# Patient Record
Sex: Female | Born: 1975 | Race: White | Hispanic: No | Marital: Married | Smoking: Never smoker
Health system: Southern US, Community
[De-identification: ages and names within clinical notes are randomized; demographics above are authoritative.]

## PROBLEM LIST (undated history)

## (undated) DIAGNOSIS — M4646 Discitis, unspecified, lumbar region: Secondary | ICD-10-CM

## (undated) HISTORY — DX: Discitis, unspecified, lumbar region: M46.46

---

## 2017-02-07 ENCOUNTER — Inpatient Hospital Stay (HOSPITAL_COMMUNITY)
Admission: AD | Admit: 2017-02-07 | Discharge: 2017-02-12 | DRG: 372 | Disposition: A | Discharge status: Home Health | Payer: No Typology Code available for payment source | Source: Ambulatory Visit | Attending: Internal Medicine | Admitting: Internal Medicine

## 2017-02-07 ENCOUNTER — Encounter: Payer: Self-pay | Admitting: Infectious Diseases

## 2017-02-07 ENCOUNTER — Ambulatory Visit (INDEPENDENT_AMBULATORY_CARE_PROVIDER_SITE_OTHER): Payer: No Typology Code available for payment source | Admitting: Infectious Diseases

## 2017-02-07 ENCOUNTER — Encounter (HOSPITAL_COMMUNITY): Payer: Self-pay

## 2017-02-07 ENCOUNTER — Other Ambulatory Visit: Payer: Self-pay

## 2017-02-07 DIAGNOSIS — R109 Unspecified abdominal pain: Secondary | ICD-10-CM | POA: Diagnosis not present

## 2017-02-07 DIAGNOSIS — M4626 Osteomyelitis of vertebra, lumbar region: Secondary | ICD-10-CM | POA: Diagnosis present

## 2017-02-07 DIAGNOSIS — R531 Weakness: Secondary | ICD-10-CM | POA: Diagnosis not present

## 2017-02-07 DIAGNOSIS — M4646 Discitis, unspecified, lumbar region: Secondary | ICD-10-CM | POA: Diagnosis present

## 2017-02-07 DIAGNOSIS — E876 Hypokalemia: Secondary | ICD-10-CM | POA: Diagnosis present

## 2017-02-07 DIAGNOSIS — Z8249 Family history of ischemic heart disease and other diseases of the circulatory system: Secondary | ICD-10-CM

## 2017-02-07 DIAGNOSIS — K6812 Psoas muscle abscess: Secondary | ICD-10-CM | POA: Diagnosis not present

## 2017-02-07 DIAGNOSIS — Z978 Presence of other specified devices: Secondary | ICD-10-CM | POA: Diagnosis not present

## 2017-02-07 DIAGNOSIS — Z95828 Presence of other vascular implants and grafts: Secondary | ICD-10-CM | POA: Diagnosis not present

## 2017-02-07 DIAGNOSIS — M79651 Pain in right thigh: Secondary | ICD-10-CM | POA: Diagnosis present

## 2017-02-07 DIAGNOSIS — B954 Other streptococcus as the cause of diseases classified elsewhere: Secondary | ICD-10-CM | POA: Diagnosis not present

## 2017-02-07 DIAGNOSIS — M79604 Pain in right leg: Secondary | ICD-10-CM | POA: Diagnosis not present

## 2017-02-07 HISTORY — DX: Discitis, unspecified, lumbar region: M46.46

## 2017-02-07 LAB — CBC WITH DIFFERENTIAL/PLATELET
Basophils Absolute: 0 10*3/uL (ref 0.0–0.1)
Basophils Relative: 0 %
EOS ABS: 0 10*3/uL (ref 0.0–0.7)
Eosinophils Relative: 0 %
HCT: 27.8 % — ABNORMAL LOW (ref 36.0–46.0)
Hemoglobin: 8.9 g/dL — ABNORMAL LOW (ref 12.0–15.0)
LYMPHS ABS: 1.8 10*3/uL (ref 0.7–4.0)
Lymphocytes Relative: 10 %
MCH: 27.3 pg (ref 26.0–34.0)
MCHC: 32 g/dL (ref 30.0–36.0)
MCV: 85.3 fL (ref 78.0–100.0)
MONO ABS: 0.7 10*3/uL (ref 0.1–1.0)
Monocytes Relative: 4 %
Neutro Abs: 14.9 10*3/uL — ABNORMAL HIGH (ref 1.7–7.7)
Neutrophils Relative %: 86 %
Platelets: 719 10*3/uL — ABNORMAL HIGH (ref 150–400)
RBC: 3.26 MIL/uL — AB (ref 3.87–5.11)
RDW: 14.3 % (ref 11.5–15.5)
WBC: 17.4 10*3/uL — AB (ref 4.0–10.5)

## 2017-02-07 LAB — COMPREHENSIVE METABOLIC PANEL
ALK PHOS: 124 U/L (ref 38–126)
ALT: 27 U/L (ref 14–54)
AST: 27 U/L (ref 15–41)
Albumin: 2.3 g/dL — ABNORMAL LOW (ref 3.5–5.0)
Anion gap: 13 (ref 5–15)
BILIRUBIN TOTAL: 0.4 mg/dL (ref 0.3–1.2)
BUN: 7 mg/dL (ref 6–20)
CALCIUM: 8.2 mg/dL — AB (ref 8.9–10.3)
CO2: 27 mmol/L (ref 22–32)
Chloride: 92 mmol/L — ABNORMAL LOW (ref 101–111)
Creatinine, Ser: 0.65 mg/dL (ref 0.44–1.00)
GFR calc Af Amer: >60 mL/min (ref >60)
GFR calc non Af Amer: >60 mL/min (ref >60)
GLUCOSE: 122 mg/dL — AB (ref 65–99)
Potassium: 2.9 mmol/L — ABNORMAL LOW (ref 3.5–5.1)
SODIUM: 132 mmol/L — AB (ref 135–145)
TOTAL PROTEIN: 7.1 g/dL (ref 6.5–8.1)

## 2017-02-07 LAB — LACTIC ACID, PLASMA: LACTIC ACID, VENOUS: 1.1 mmol/L (ref 0.5–1.9)

## 2017-02-07 MED ORDER — ACETAMINOPHEN 325 MG PO TABS
650.0000 mg | ORAL_TABLET | Freq: Four times a day (QID) | ORAL | Status: DC | PRN
  Administered 2017-02-07 – 2017-02-10 (×7): 650 mg via ORAL
  Filled 2017-02-07 (×7): qty 2

## 2017-02-07 MED ORDER — SODIUM CHLORIDE 0.9 % IV SOLN
INTRAVENOUS | Status: DC
  Administered 2017-02-07 – 2017-02-11 (×9): via INTRAVENOUS

## 2017-02-07 MED ORDER — CEFTRIAXONE SODIUM 1 G IJ SOLR
1.0000 g | Freq: Once | INTRAMUSCULAR | Status: AC
  Administered 2017-02-07: 1 g via INTRAVENOUS
  Filled 2017-02-07: qty 10

## 2017-02-07 MED ORDER — VANCOMYCIN HCL IN DEXTROSE 1-5 GM/200ML-% IV SOLN
1000.0000 mg | Freq: Once | INTRAVENOUS | Status: AC
  Administered 2017-02-07: 20:00:00 1000 mg via INTRAVENOUS
  Filled 2017-02-07: qty 200

## 2017-02-07 MED ORDER — MORPHINE SULFATE (PF) 2 MG/ML IV SOLN
2.0000 mg | INTRAVENOUS | Status: DC | PRN
  Administered 2017-02-09: 2 mg via INTRAVENOUS
  Filled 2017-02-07: qty 1

## 2017-02-07 MED ORDER — POTASSIUM CHLORIDE CRYS ER 20 MEQ PO TBCR
40.0000 meq | EXTENDED_RELEASE_TABLET | ORAL | Status: AC
  Administered 2017-02-07 – 2017-02-08 (×2): 40 meq via ORAL
  Filled 2017-02-07 (×2): qty 2

## 2017-02-07 MED ORDER — HYDROCODONE-ACETAMINOPHEN 5-325 MG PO TABS
1.0000 | ORAL_TABLET | ORAL | Status: DC | PRN
  Administered 2017-02-09: 11:00:00 1 via ORAL
  Administered 2017-02-09: 2 via ORAL
  Filled 2017-02-07: qty 2
  Filled 2017-02-07: qty 1

## 2017-02-07 MED ORDER — ENSURE ENLIVE PO LIQD: 237.0000 mL | Freq: Two times a day (BID) | ORAL | Status: DC

## 2017-02-07 NOTE — Progress Notes (Signed)
Dr.Adhikari notified of lab values and vital signs.

## 2017-02-07 NOTE — Progress Notes (Signed)
   Subjective:    Patient ID: Joy Morris, female    DOB: January 18, 1975, 42 y.o.   MRN: [id number]  HPI 42 yo F with hx of back pain begining around christmas 2018. Her pain worsened and radiated around her leg. She also developed paresthesias down her legs. She had been given medrol dose packs without relief. She was seen by orthopedics 01-20-17 and underwent MRI on 01-31-17. 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  Still having pain, concentrated around front of her R leg. Also feels that R leg doesn't want to "lift on it's own". Pain has been constant.  On meloxicam, muscle relaxer, tylenol #3 Has been having cold sweats, drenching. Having chills as well.   Works as an Airline pilotoptitrician.   The past medical history, family history and social history were reviewed/updated in EPIC  Review of Systems  Constitutional: Positive for appetite change, chills, fever and unexpected weight change.  Respiratory: Positive for shortness of breath. Negative for cough.   Cardiovascular: Negative for chest pain.  Gastrointestinal: Positive for constipation. Negative for diarrhea.  Genitourinary: Negative for difficulty urinating.  Neurological: Positive for weakness and numbness.  Psychiatric/Behavioral: Positive for dysphoric mood. Negative for sleep disturbance.  pleuritic pain.      Objective:   Physical Exam  Constitutional: She is oriented to person, place, and time. She appears well-developed and well-nourished.  HENT:  Mouth/Throat: No oropharyngeal exudate.  Eyes: EOM are normal. Pupils are equal, round, and reactive to light.  Neck: Neck supple.  Cardiovascular: Normal rate, regular rhythm and normal heart sounds.  Pulmonary/Chest: Effort normal and breath sounds normal.  Abdominal: Soft. Bowel sounds are normal. There is no tenderness. There is no rebound.  Musculoskeletal: Normal range of motion. She exhibits no edema.  Lymphadenopathy:    She has no  cervical adenopathy.  Neurological: She is alert and oriented to person, place, and time.  Decreased light touch in R ankle.  She has mild decrease in strength in R quads.        Assessment & Plan:

## 2017-02-07 NOTE — Assessment & Plan Note (Addendum)
Denies breaks in her skin in last several months.  No hx of surgery.  We discussed possible options- 1) admit her, have her seen by ortho, have her seen by IR for aspirate, have her seen by IV team, get PIC, home with vanco/ceftriaxone while we await Cx.  I favor this as she does have some subjective findings in her R leg (wekaness and numbness).  2) have her seen by IR for aspirate and PIC outpt, surgery outpt, start anbx.  She opts for option (1) due to risk of sepsis, worsening of neuro function.  I called the hospitalist admitting number, she will be adm to [facility]. Spoke with Dr Janee Mornhompson Will have ID see her in the hospital.  I left a message on  Ortho PA's phone regarding admission.

## 2017-02-07 NOTE — H&P (Signed)
History and Physical    Joy PennaCorie Morris ZOX:[id number] DOB: 10-27-1975 DOA: 02/07/2017  PCP: Patient, No Pcp Per   Patient coming from: Home    Chief Complaint: Pain on the right thigh  HPI: Joy Morris is a 42 y.o. female with no significant past medical history who was sent from infectious disease office with a direct admission for the drainage of psoas abscess. As per the patient, she started having back pain, right thigh pain that started on December 22, 2016.  Her pain worsened and it started radiating towards her leg.  She also developed paresthesia.   Patient was seen by orthopedics on 01/20/17 .She was being  treated with antispasmodics and  pain medications.  She underwent MRI on 01/31/17.  As per the documentation, she was found to have discitis, osteomyelitis of L2-L3 with large right iliopsoas abscess. Upon asking, patient is not aware that she had been diagnosed with infection in her bone. Patient also reported having chills and night sweats.  She denies having fever though.  Patient does not have any significant past medical history.  She has been walking with the help of cane recently. The patient was referred to infectious disease by orthopedics after the MRI finding.  Infectious disease seen the patient with direct admission to the hospital for drainage of the abscess. Patient seen and examined the bedside.  She does not look toxic.  She complains of pain on her right lower abdomen, right thigh and says she has difficulty lifting her right lower extremity.  There is swelling of right thigh but the overlying skin is normal.  There is generalized tenderness over her left lower abdomen and right upper thigh. IR has been notified and she will undergo IR guided drainage of abscess tomorrow.  Plan is to start on antibiotics tomorrow for the drainage procedure.  ED Course: None  Review of Systems: As per HPI otherwise 10 point review of systems negative.    Past Medical History:    Diagnosis Date  . Discitis of lumbar region 02/07/2017       reports that  has never smoked. she has never used smokeless tobacco. She reports that she does not drink alcohol or use drugs.  No Known Allergies  Family History  Problem Relation Age of Onset  . Hypertension Mother   . Hyperlipidemia Mother   . Leukemia Father        CML     Prior to Admission medications   Medication Sig Start Date End Date Taking? Authorizing Provider  acetaminophen-codeine (TYLENOL #3) 300-30 MG tablet acetaminophen 300 mg-codeine 30 mg tablet    [provider]  ibuprofen (ADVIL,MOTRIN) 800 MG tablet  01/31/17   [provider]  ibuprofen (ADVIL,MOTRIN) 800 MG tablet ibuprofen 800 mg tablet  Take 1 tablet every 8 hours by oral route as needed.    [provider]  meloxicam (MOBIC) 15 MG tablet meloxicam 15 mg tablet    [provider]  methocarbamol (ROBAXIN) 500 MG tablet TK 2 TS PO Q 6 TO 8 H PRN 01/08/17   [provider]  methylPREDNISolone (MEDROL DOSEPAK) 4 MG TBPK tablet TK UTD 01/15/17   [provider]    Physical Exam: There were no vitals filed for this visit.  Constitutional: NAD, calm, comfortable There were no vitals filed for this visit. Eyes: PERRL, lids and conjunctivae normal ENMT: Mucous membranes are moist. Posterior pharynx clear of any exudate or lesions.Normal dentition.  Neck: normal, supple, no masses, no  thyromegaly Respiratory: clear to auscultation bilaterally, no wheezing, no crackles. Normal respiratory effort. No accessory muscle use.  Cardiovascular: Regular rate and rhythm, no murmurs / rubs / gallops. No extremity edema. 2+ pedal pulses. No carotid bruits.  Abdomen: no tenderness, no masses palpated. No hepatosplenomegaly. Bowel sounds positive.  Musculoskeletal: no clubbing / cyanosis. No joint deformity upper and lower extremities. Swelling of the right upper thigh, generalized tenderness on the right  lower abdomen and right upper thigh , decreased range of motion of the whole right lower extremity skin: no rashes, lesions, ulcers. No induration Neurologic: CN 2-12 grossly intact. Sensation intact, DTR normal. Strength 5/5 in all 4.  Psychiatric: Normal judgment and insight. Alert and oriented x 3. Normal mood.   Foley Catheter:None  Labs on Admission: I have personally reviewed following labs and imaging studies  CBC: No results for input(s): WBC, NEUTROABS, HGB, HCT, MCV, PLT in the last 168 hours. Basic Metabolic Panel: No results for input(s): NA, K, CL, CO2, GLUCOSE, BUN, CREATININE, CALCIUM, MG, PHOS in the last 168 hours. GFR: CrCl cannot be calculated (No order found.). Liver Function Tests: No results for input(s): AST, ALT, ALKPHOS, BILITOT, PROT, ALBUMIN in the last 168 hours. No results for input(s): LIPASE, AMYLASE in the last 168 hours. No results for input(s): AMMONIA in the last 168 hours. Coagulation Profile: No results for input(s): INR, PROTIME in the last 168 hours. Cardiac Enzymes: No results for input(s): CKTOTAL, CKMB, CKMBINDEX, TROPONINI in the last 168 hours. BNP (last 3 results) No results for input(s): PROBNP in the last 8760 hours. HbA1C: No results for input(s): HGBA1C in the last 72 hours. CBG: No results for input(s): GLUCAP in the last 168 hours. Lipid Profile: No results for input(s): CHOL, HDL, LDLCALC, TRIG, CHOLHDL, LDLDIRECT in the last 72 hours. Thyroid Function Tests: No results for input(s): TSH, T4TOTAL, FREET4, T3FREE, THYROIDAB in the last 72 hours. Anemia Panel: No results for input(s): VITAMINB12, FOLATE, FERRITIN, TIBC, IRON, RETICCTPCT in the last 72 hours. Urine analysis: No results found for: COLORURINE, APPEARANCEUR, LABSPEC, PHURINE, GLUCOSEU, HGBUR, BILIRUBINUR, KETONESUR, PROTEINUR, UROBILINOGEN, NITRITE, LEUKOCYTESUR  Radiological Exams on Admission: No results found.   Assessment/Plan Principal Problem:   Psoas  abscess, right (HCC)  Right Psoas Abscess: Interventional radiology consulted.  Will undergo IR guided drainage tomorrow. Will start on IV antibiotics with vancomycin and Rocephin after the drainage.  We will follow-up the culture report. We will also consult orthopedics.  She was seen by Dr. Yevette Edwards as an outpatient. Will get CBC/CMP. We will continue pain management as appropriate.  Osteomyelitis/Discitis: Infectious disease note on 02/07/17 says that she was found to have discitis, osteomyelitis of L2-L3. Patient not aware of this diagnosis.  ID will follow.  Severity of Illness:   I certify that at the point of admission it is my clinical judgment that the patient will require inpatient hospital care spanning beyond 2 midnights from the point of admission due to high intensity of service, high risk for further deterioration and high frequency of surveillance required.    DVT prophylaxis: SCD Code Status: Full Family Communication: Husband present at the bedside Consults called: Interventional radiology     Meredith Leeds MD [facility] Pager [phone]  If 7PM-7AM, please contact night-coverage [email or web address] Password [email or web address]  02/07/2017, 5:50 PM

## 2017-02-08 ENCOUNTER — Inpatient Hospital Stay (HOSPITAL_COMMUNITY): Payer: No Typology Code available for payment source

## 2017-02-08 LAB — PROTIME-INR
INR: 1.24
Prothrombin Time: 15.5 seconds — ABNORMAL HIGH (ref 11.4–15.2)

## 2017-02-08 LAB — BASIC METABOLIC PANEL
ANION GAP: 9 (ref 5–15)
BUN: 5 mg/dL — AB (ref 6–20)
CALCIUM: 8 mg/dL — AB (ref 8.9–10.3)
CO2: 26 mmol/L (ref 22–32)
CREATININE: 0.5 mg/dL (ref 0.44–1.00)
Chloride: 103 mmol/L (ref 101–111)
GFR calc Af Amer: >60 mL/min (ref >60)
GFR calc non Af Amer: >60 mL/min (ref >60)
GLUCOSE: 114 mg/dL — AB (ref 65–99)
Potassium: 3.7 mmol/L (ref 3.5–5.1)
Sodium: 138 mmol/L (ref 135–145)

## 2017-02-08 LAB — CBC WITH DIFFERENTIAL/PLATELET
BASOS ABS: 0 10*3/uL (ref 0.0–0.1)
Basophils Relative: 0 %
EOS PCT: 0 %
Eosinophils Absolute: 0 10*3/uL (ref 0.0–0.7)
HEMATOCRIT: 26.1 % — AB (ref 36.0–46.0)
Hemoglobin: 8.5 g/dL — ABNORMAL LOW (ref 12.0–15.0)
LYMPHS PCT: 11 %
Lymphs Abs: 1.7 10*3/uL (ref 0.7–4.0)
MCH: 27.9 pg (ref 26.0–34.0)
MCHC: 32.6 g/dL (ref 30.0–36.0)
MCV: 85.6 fL (ref 78.0–100.0)
MONO ABS: 0.7 10*3/uL (ref 0.1–1.0)
MONOS PCT: 5 %
NEUTROS ABS: 13.3 10*3/uL — AB (ref 1.7–7.7)
Neutrophils Relative %: 84 %
PLATELETS: 637 10*3/uL — AB (ref 150–400)
RBC: 3.05 MIL/uL — ABNORMAL LOW (ref 3.87–5.11)
RDW: 14.5 % (ref 11.5–15.5)
WBC: 15.7 10*3/uL — ABNORMAL HIGH (ref 4.0–10.5)

## 2017-02-08 LAB — HIV ANTIBODY (ROUTINE TESTING W REFLEX): HIV SCREEN 4TH GENERATION: NONREACTIVE

## 2017-02-08 LAB — MAGNESIUM: Magnesium: 2.1 mg/dL (ref 1.7–2.4)

## 2017-02-08 MED ORDER — MIDAZOLAM HCL 2 MG/2ML IJ SOLN
INTRAMUSCULAR | Status: DC | PRN
  Administered 2017-02-08 (×2): 1 mg via INTRAVENOUS

## 2017-02-08 MED ORDER — VANCOMYCIN HCL IN DEXTROSE 750-5 MG/150ML-% IV SOLN
750.0000 mg | Freq: Two times a day (BID) | INTRAVENOUS | Status: DC
  Administered 2017-02-09 – 2017-02-10 (×4): 750 mg via INTRAVENOUS
  Filled 2017-02-08 (×6): qty 150

## 2017-02-08 MED ORDER — DEXTROSE 5 % IV SOLN
2.0000 g | INTRAVENOUS | Status: DC
  Administered 2017-02-08 – 2017-02-11 (×4): 2 g via INTRAVENOUS
  Filled 2017-02-08 (×4): qty 20

## 2017-02-08 MED ORDER — FENTANYL CITRATE (PF) 100 MCG/2ML IJ SOLN
INTRAMUSCULAR | Status: AC
  Filled 2017-02-08: qty 4

## 2017-02-08 MED ORDER — NALOXONE HCL 0.4 MG/ML IJ SOLN
INTRAMUSCULAR | Status: AC
  Filled 2017-02-08: qty 1

## 2017-02-08 MED ORDER — FLUMAZENIL 0.5 MG/5ML IV SOLN
INTRAVENOUS | Status: AC
  Filled 2017-02-08: qty 5

## 2017-02-08 MED ORDER — FENTANYL CITRATE (PF) 100 MCG/2ML IJ SOLN
INTRAMUSCULAR | Status: DC | PRN
  Administered 2017-02-08 (×2): 50 ug via INTRAVENOUS

## 2017-02-08 MED ORDER — SODIUM CHLORIDE 0.9% FLUSH
5.0000 mL | Freq: Three times a day (TID) | INTRAVENOUS | Status: DC
  Administered 2017-02-08 – 2017-02-12 (×10): 5 mL via INTRAVENOUS

## 2017-02-08 MED ORDER — MIDAZOLAM HCL 2 MG/2ML IJ SOLN
INTRAMUSCULAR | Status: AC
  Filled 2017-02-08: qty 4

## 2017-02-08 MED ORDER — VANCOMYCIN HCL 10 G IV SOLR
1500.0000 mg | Freq: Once | INTRAVENOUS | Status: AC
  Administered 2017-02-08: 1500 mg via INTRAVENOUS
  Filled 2017-02-08: qty 1500

## 2017-02-08 MED ORDER — ENSURE ENLIVE PO LIQD: 237.0000 mL | Freq: Every day | ORAL | Status: DC | PRN

## 2017-02-08 MED ORDER — LIDOCAINE HCL 1 % IJ SOLN
INTRAMUSCULAR | Status: DC | PRN
  Administered 2017-02-08: 10 mL

## 2017-02-08 NOTE — Consult Note (Signed)
Chief Complaint: Patient was seen in consultation today for CT-guided aspiration/drainage of right iliopsoas fluid collection/abscess  Referring Physician(s): Hatcher,J/Thompson,D  Supervising Physician: Oley Balm  Patient Status: [facility]  History of Present Illness: Joy Morris is a 42 y.o. female with no significant past medical or surgical history who presents now with history of back pain since December of last year which is now progressed with radiation down right upper leg in addition to paresthesias.  Pain has persisted despite steroid use. 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  She has had recent sweats, chills, some dyspnea with exertion and constipation in addition to above.  Request now received for image guided aspiration/drainage of the large right iliopsoas fluid collection/abscess.  Past Medical History:  Diagnosis Date  . 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 No pertinent surgical history.  Allergies: Patient has no known allergies.  Medications: Prior to Admission medications   Medication Sig Start Date End Date Taking? Authorizing Provider  acetaminophen-codeine (TYLENOL #3) 300-30 MG tablet Take 1 tablet by mouth every 6 hours prn for pain   Yes [provider]  fluticasone (FLONASE) 50 MCG/ACT nasal spray Place 1 spray into both nostrils daily.   Yes [provider]  methocarbamol (ROBAXIN) 500 MG tablet TK 2 TS PO Q 6 TO 8 H PRN FOR INFLAMMATION AND PAIN 01/08/17  Yes [provider]  meloxicam (MOBIC) 15 MG tablet meloxicam 15 mg tablet    [provider]     Family History  Problem Relation Age of Onset  . Hypertension Mother   . Hyperlipidemia Mother   . Leukemia Father        CML    Social History   Socioeconomic History  . Marital status:  Married    Spouse name: None  . Number of children: None  . Years of education: None  . Highest education level: None  Social Needs  . Financial resource strain: None  . Food insecurity - worry: None  . Food insecurity - inability: None  . Transportation needs - medical: None  . Transportation needs - non-medical: None  Occupational History  . None  Tobacco Use  . Smoking status: Never Smoker  . Smokeless tobacco: Never Used  Substance and Sexual Activity  . Alcohol use: No    Frequency: Never  . Drug use: No  . Sexual activity: None  Other Topics Concern  . None  Social History Narrative  . None      Review of Systems see above; denies headache, chest pain, cough, nausea, vomiting or bleeding.  Vital Signs: BP 123/65 (BP Location: Right Arm)   Pulse (!) 116   Temp (!) 100.9 F (38.3 C) (Axillary)   Resp 19   Ht 5\' 1"  (1.549 m)   Wt 160 lb 14.4 oz (73 kg)   SpO2 98%   BMI 30.40 kg/m   Physical Exam awake, alert.  Chest clear to auscultation bilaterally.  Heart with tachycardic but regular rhythm.  Abdomen soft, positive bowel sounds, mild to moderately tender right lateral/lower abdomen/flank and right thigh region; diminished sensation right ankle region , decreased range of motion right lower extremity Imaging: No results found.  Labs:  CBC: Recent Labs    02/07/17 1739 02/08/17 0616  WBC 17.4* 15.7*  HGB 8.9* 8.5*  HCT 27.8* 26.1*  PLT 719*  637*    COAGS: Recent Labs    02/08/17 0841  INR 1.24    BMP: Recent Labs    02/07/17 1739 02/08/17 0616  NA 132* 138  K 2.9* 3.7  CL 92* 103  CO2 27 26  GLUCOSE 122* 114*  BUN 7 5*  CALCIUM 8.2* 8.0*  CREATININE 0.65 0.50  GFRNONAA >60 >60  GFRAA >60 >60    LIVER FUNCTION TESTS: Recent Labs    02/07/17 1739  BILITOT 0.4  AST 27  ALT 27  ALKPHOS 124  PROT 7.1  ALBUMIN 2.3*    TUMOR MARKERS: No results for input(s): AFPTM, CEA, CA199, CHROMGRNA in the last 8760 hours.  Assessment  and Plan: 42 y.o. female with no significant past medical or surgical history who presents now with history of back pain since December of last year which is now progressed with radiation down right upper leg in addition to paresthesias.  Pain has persisted despite steroid use. 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  She has had recent sweats, chills, some dyspnea with exertion and constipation in addition to above.  Request now received for image guided aspiration/drainage of the large right iliopsoas fluid collection/abscess.  Recent MRI reviewed by Dr. Deanne CofferHassell.  Details/risks of procedure, including but not limited to, internal bleeding, infection, injury to adjacent structures, need for prolonged drainage discussed with patient and husband with their understanding and consent.  Procedure scheduled for later today.  Thank you for this interesting consult.  I greatly enjoyed meeting Joy Morris and look forward to participating in their care.  A copy of this report was sent to the requesting provider on this date.  Electronically Signed: D. Jeananne RamaKevin Allred, PA-C 02/08/2017, 1:20 PM   I spent a total of 25 minutes  in face to face in clinical consultation, greater than 50% of which was counseling/coordinating care for CT-guided aspiration/possible drainage of right iliopsoas fluid collection

## 2017-02-08 NOTE — Progress Notes (Signed)
[facility]fusion Coordinator will follow pt with Dr. Rowland LatheHatcher/ID team for home Infusion services for home IV ABX at [facility].  If patient discharges after hours, please call [phone].   Joy Morris 02/08/2017, 12:30 PM

## 2017-02-08 NOTE — Procedures (Signed)
  Procedure: CT R psoas abscess drain placement 35F; 30ml pus out, sent for GS, C&S EBL:   minimal Complications:  none immediate  See full dictation in [facility] PACS.  Thora Lance. Ashlee Bewley MD Main # [phone] Pager  [phone]

## 2017-02-08 NOTE — Progress Notes (Signed)
PROGRESS NOTE    Vickii PennaCorie Meacham  ZHY:[id number] DOB: 06/25/1975 DOA: 02/07/2017 PCP: Patient, No Pcp Per   Brief Narrative:   42 year old female with no significant past medical history does not take any medications at home has been dealing with right-sided hip pain and lower back pain for the past 5-6 weeks.  At first she was seen in the ER and was treated with pain medications but later because her pain did not improve she was seen by orthopedic who ordered an MRI and was noted to have right iliopsoas abscess along with osteomyelitis and discitis of L2-L3.  She was sent to the hospital for further evaluation, IV antibiotics and drainage of her abscess.  Assessment & Plan:   Principal Problem:   Psoas abscess, right (HCC)   Right hip pain Right iliopsoas abscess Osteomyelitis of L3-L4 -At this time patient is scheduled for IR guided drainage for the abscess - Following the drainage will start the patient on IV vancomycin and Rocephin -Pain control, IV fluids as necessary -She can resume oral diet after her procedure -Infectious disease is following.     DVT prophylaxis: SCDs Code Status: Full code Family Communication: Husband at bedside Disposition Plan: Maintain inpatient stay  Consultants:  Interventional radiology Infectious disease  Procedures:   CT-guided drainage of her abscess plan today  Antimicrobials:   Vancomycin December 08, 2017  Rocephin December 08, 2017   Subjective: No complaints this morning, she states she feels a little better and her pain is well controlled.  She is anxious to get this procedure done.  She denies any history of trauma, illicit drug use, HIV or any other risk factors that may have led to this.  Objective: Vitals:   02/07/17 1700 02/07/17 1818 02/07/17 2108 02/08/17 0651  BP:  126/69 107/62 123/65  Pulse:  (!) 123 (!) 119 (!) 116  Resp:  18 17 19   Temp:  (!) 102.7 F (39.3 C) (!) 100.4 F (38 C) (!) 100.9 F (38.3 C)    TempSrc:  Oral Axillary Axillary  SpO2:  98% 96% 98%  Weight: 73 kg (160 lb 14.4 oz)     Height: 5\' 1"  (1.549 m)       Intake/Output Summary (Last 24 hours) at 02/08/2017 1400 Last data filed at 02/08/2017 0800 Gross per 24 hour  Intake 1825 ml  Output -  Net 1825 ml   Filed Weights   02/07/17 1700  Weight: 73 kg (160 lb 14.4 oz)    Examination:  General exam: Appears calm and comfortable  Respiratory system: Clear to auscultation. Respiratory effort normal. Cardiovascular system: S1 & S2 heard, RRR. No JVD, murmurs, rubs, gallops or clicks. No pedal edema. Gastrointestinal system: Abdomen is nondistended, soft and nontender. No organomegaly or masses felt. Normal bowel sounds heard. Central nervous system: Alert and oriented. No focal neurological deficits. Extremities: Symmetric 5 x 5 power.  Limited range of motion of her right hip. Skin: No rashes, lesions or ulcers Psychiatry: Judgement and insight appear normal. Mood & affect appropriate.     Data Reviewed:   CBC: Recent Labs  Lab 02/07/17 1739 02/08/17 0616  WBC 17.4* 15.7*  NEUTROABS 14.9* 13.3*  HGB 8.9* 8.5*  HCT 27.8* 26.1*  MCV 85.3 85.6  PLT 719* 637*   Basic Metabolic Panel: Recent Labs  Lab 02/07/17 1739 02/08/17 0616  NA 132* 138  K 2.9* 3.7  CL 92* 103  CO2 27 26  GLUCOSE 122* 114*  BUN 7 5*  CREATININE  0.65 0.50  CALCIUM 8.2* 8.0*  MG  --  2.1   GFR: Estimated Creatinine Clearance: 82.9 mL/min (by C-G formula based on SCr of 0.5 mg/dL). Liver Function Tests: Recent Labs  Lab 02/07/17 1739  AST 27  ALT 27  ALKPHOS 124  BILITOT 0.4  PROT 7.1  ALBUMIN 2.3*   No results for input(s): LIPASE, AMYLASE in the last 168 hours. No results for input(s): AMMONIA in the last 168 hours. Coagulation Profile: Recent Labs  Lab 02/08/17 0841  INR 1.24   Cardiac Enzymes: No results for input(s): CKTOTAL, CKMB, CKMBINDEX, TROPONINI in the last 168 hours. BNP (last 3 results) No results  for input(s): PROBNP in the last 8760 hours. HbA1C: No results for input(s): HGBA1C in the last 72 hours. CBG: No results for input(s): GLUCAP in the last 168 hours. Lipid Profile: No results for input(s): CHOL, HDL, LDLCALC, TRIG, CHOLHDL, LDLDIRECT in the last 72 hours. Thyroid Function Tests: No results for input(s): TSH, T4TOTAL, FREET4, T3FREE, THYROIDAB in the last 72 hours. Anemia Panel: No results for input(s): VITAMINB12, FOLATE, FERRITIN, TIBC, IRON, RETICCTPCT in the last 72 hours. Sepsis Labs: Recent Labs  Lab 02/07/17 1947  LATICACIDVEN 1.1    No results found for this or any previous visit (from the past 240 hour(s)).       Radiology Studies: No results found.      Scheduled Meds: Continuous Infusions: . sodium chloride 150 mL/hr at 02/07/17 2018     LOS: 1 day    Time spent: 30 mins     Keyara Ent Joline Maxcy, MD [facility] Pager [phone]   If 7PM-7AM, please contact night-coverage [email or web address] Password [email or web address] 02/08/2017, 2:00 PM

## 2017-02-08 NOTE — Progress Notes (Signed)
Nutrition Brief Note  Patient identified on the Malnutrition Screening Tool (MST) Report  Wt Readings from Last 15 Encounters:  02/07/17 160 lb 14.4 oz (73 kg)  02/07/17 156 lb (70.8 kg)    Body mass index is 30.4 kg/m. Patient meets criteria for obesity based on current BMI. Per review of Care Everywhere, pt weighed 170 lbs at [facility] on 03/15/15. This indicates as 10 lb weight loss (6% body weight) since that time which is not significant. Weight loss may have occurred acutely but suspect that at least partial fluid related given high rate IVF.   Pt with no PMH. She went to ID office and was sent to ED for psoas abscess. Plan for IR-guided drainage today.   Current diet order is NPO. She was on Regular diet from 5:30 PM-midnight yesterday with no documented intakes during that time.    Medications reviewed; 40 mEq oral KCl x2 doses yesterday.  Labs reviewed; BUN: 5 mg/dL, Ca: 8 mg/dL. IVF: NS @ 150 mL/hr.  Ensure Enlive ordered BID per ONS protocol, each supplement provides 350 kcal and 20 grams of protein. Will change to once/day PRN. No additional nutrition interventions warranted at this time. If nutrition issues arise, please consult RD.      Trenton GammonJessica Dayanira Giovannetti, MS, RD, LDN, [facility] Inpatient Clinical Dietitian Pager # [phone] After hours/weekend pager # [phone]

## 2017-02-08 NOTE — Progress Notes (Signed)
Pharmacy Antibiotic Note  Joy Morris is a 42 y.o. female admitted on 02/07/2017 with discitis, osteomyelitis L2-3 with large R iliopsoas abscess.  She received Rocephin 1gm & Vancomycin 1gm x1 in ED last night.  Plan to continue antibiotics pending CT guided I&D of abscess.  Pharmacy has been consulted for Vancomycin dosing. 02/08/2017:  Tm 102.7, Tc 100.9  Leukocytosis improving 17.4>15.7  Excellent renal function- Est CrCl ~ 7880ml/min  Plan:  Rocephin 2gm IV q24h  Vancomycin 1500mg  IV x1 after procedure then 750mg  IV q12h  Check Vanc levels at steady-state  Monitor renal function and cx data   Height: 5\' 1"  (154.9 cm) Weight: 160 lb 14.4 oz (73 kg) IBW/kg (Calculated) : 47.8  Temp (24hrs), Avg:101.3 F (38.5 C), Min:100.4 F (38 C), Max:102.7 F (39.3 C)  Recent Labs  Lab 02/07/17 1739 02/07/17 1947 02/08/17 0616  WBC 17.4*  --  15.7*  CREATININE 0.65  --  0.50  LATICACIDVEN  --  1.1  --     Estimated Creatinine Clearance: 82.9 mL/min (by C-G formula based on SCr of 0.5 mg/dL).    No Known Allergies  Antimicrobials this admission: 2/6 Rocephin >>  2/6 Vanc >>   Dose adjustments this admission:  Microbiology results: 2/7 BCx:  2/7 abscess:   Thank you for allowing pharmacy to be a part of this patient's care.  Elson ClanLilliston, Romir Klimowicz Michelle 02/08/2017 1:46 PM

## 2017-02-08 NOTE — Sedation Documentation (Signed)
Medicated for pain 

## 2017-02-08 NOTE — Sedation Documentation (Signed)
remedicating

## 2017-02-08 NOTE — Progress Notes (Signed)
Patient ID: Joy Morris, female   DOB: May 23, 1975, 42 y.o.   MRN: [id number]          [facility]    Date of Admission:  02/07/2017     Ms. Joy Morris has 2-3 discitis complicated by a right psoas abscess.  She is scheduled for aspiration of the abscess this afternoon.  I would restart vancomycin and ceftriaxone after the procedure pending culture results. Blood cultures are negative so far. I will follow up tomorrow morning.      Cliffton AstersJohn Makensie Mulhall, MD [facility] [phone] pager   [phone] cell 02/08/2017, 2:07 PM

## 2017-02-09 DIAGNOSIS — Z978 Presence of other specified devices: Secondary | ICD-10-CM

## 2017-02-09 DIAGNOSIS — M4646 Discitis, unspecified, lumbar region: Secondary | ICD-10-CM

## 2017-02-09 DIAGNOSIS — K6812 Psoas muscle abscess: Principal | ICD-10-CM

## 2017-02-09 LAB — CBC
HEMATOCRIT: 25.3 % — AB (ref 36.0–46.0)
Hemoglobin: 8.1 g/dL — ABNORMAL LOW (ref 12.0–15.0)
MCH: 27.8 pg (ref 26.0–34.0)
MCHC: 32 g/dL (ref 30.0–36.0)
MCV: 86.9 fL (ref 78.0–100.0)
Platelets: 638 10*3/uL — ABNORMAL HIGH (ref 150–400)
RBC: 2.91 MIL/uL — ABNORMAL LOW (ref 3.87–5.11)
RDW: 14.7 % (ref 11.5–15.5)
WBC: 13.8 10*3/uL — AB (ref 4.0–10.5)

## 2017-02-09 LAB — BASIC METABOLIC PANEL
Anion gap: 7 (ref 5–15)
CHLORIDE: 106 mmol/L (ref 101–111)
CO2: 24 mmol/L (ref 22–32)
Calcium: 7.5 mg/dL — ABNORMAL LOW (ref 8.9–10.3)
Creatinine, Ser: 0.52 mg/dL (ref 0.44–1.00)
GFR calc non Af Amer: >60 mL/min (ref >60)
Glucose, Bld: 112 mg/dL — ABNORMAL HIGH (ref 65–99)
POTASSIUM: 2.8 mmol/L — AB (ref 3.5–5.1)
SODIUM: 137 mmol/L (ref 135–145)

## 2017-02-09 LAB — MAGNESIUM: MAGNESIUM: 1.9 mg/dL (ref 1.7–2.4)

## 2017-02-09 MED ORDER — OXYCODONE HCL 5 MG PO TABS
5.0000 mg | ORAL_TABLET | ORAL | Status: DC | PRN
  Administered 2017-02-09: 20:00:00 10 mg via ORAL
  Administered 2017-02-10: 08:00:00 5 mg via ORAL
  Administered 2017-02-10 – 2017-02-12 (×9): 10 mg via ORAL
  Filled 2017-02-09 (×4): qty 2
  Filled 2017-02-09: qty 1
  Filled 2017-02-09 (×6): qty 2

## 2017-02-09 MED ORDER — POTASSIUM CHLORIDE CRYS ER 20 MEQ PO TBCR
40.0000 meq | EXTENDED_RELEASE_TABLET | Freq: Once | ORAL | Status: AC
  Administered 2017-02-09: 14:00:00 40 meq via ORAL
  Filled 2017-02-09: qty 2

## 2017-02-09 MED ORDER — SODIUM CHLORIDE 0.9% FLUSH
10.0000 mL | INTRAVENOUS | Status: DC | PRN
  Administered 2017-02-12: 16:00:00 10 mL
  Filled 2017-02-09: qty 40

## 2017-02-09 MED ORDER — POTASSIUM CHLORIDE CRYS ER 20 MEQ PO TBCR
40.0000 meq | EXTENDED_RELEASE_TABLET | Freq: Once | ORAL | Status: AC
  Administered 2017-02-09: 40 meq via ORAL
  Filled 2017-02-09: qty 2

## 2017-02-09 NOTE — Progress Notes (Signed)
PROGRESS NOTE    Joy Morris  ZOX:[id number] DOB: [birth year] DOA: 02/07/2017 PCP: Patient, No Pcp Per   Brief Narrative:   42 year old female with no significant past medical history does not take any medications at home has been dealing with right-sided hip pain and lower back pain for the past 5-6 weeks.  At first she was seen in the ER and was treated with pain medications but later because her pain did not improve she was seen by orthopedic who ordered an MRI and was noted to have right iliopsoas abscess along with osteomyelitis and discitis of L2-L3.  She was sent to the hospital for further evaluation, IV antibiotics and drainage of her abscess.  Patient underwent drainage placement on 2/7.  IV antibiotics vancomycin and Rocephin were started.  Assessment & Plan:   Principal Problem:   Psoas abscess, right (HCC)   Right hip pain Right iliopsoas abscess status post drain placement Osteomyelitis of L3-L4 -Patient is currently status post drain placement 2/7 -Continue vancomycin and Rocephin day 2 -Infectious disease following - PICC line placement has been ordered as she will need antibiotics for 6 weeks.  We will adjust antibiotics as culture data becomes available. -Pain control, IV fluids as necessary  Hypokalemia -Repletion ordered     DVT prophylaxis: SCDs Code Status: Full code Family Communication: Husband at bedside Disposition Plan: Maintain inpatient stay  Consultants:  Interventional radiology Infectious disease  Procedures:   CT-guided drainage of her abscess 2/7  Antimicrobials:   Vancomycin February 08, 2017  Rocephin February 08, 2017   Subjective: No complaints besides mild pain at the drainage site.  Objective: Vitals:   02/08/17 1853 02/08/17 2052 02/09/17 0458 02/09/17 1313  BP: 117/61 (!) 123/59 (!) 114/58 115/63  Pulse: (!) 113 (!) 119 98 (!) 101  Resp: 16 17 14 16   Temp: 99.1 F (37.3 C) 97.8 F (36.6 C) 98.7 F (37.1 C) 97.6 F  (36.4 C)  TempSrc: Axillary Oral Oral Oral  SpO2: 98% 96% 100% 100%  Weight:      Height:        Intake/Output Summary (Last 24 hours) at 02/09/2017 1329 Last data filed at 02/09/2017 1314 Gross per 24 hour  Intake 3838 ml  Output 62 ml  Net 3776 ml   Filed Weights   02/07/17 1700  Weight: 73 kg (160 lb 14.4 oz)    Examination:  General exam: Appears calm and comfortable  Respiratory system: Clear to auscultation. Respiratory effort normal. Cardiovascular system: S1 & S2 heard, RRR. No JVD, murmurs, rubs, gallops or clicks. No pedal edema. Gastrointestinal system: Abdomen is nondistended, soft and nontender. No organomegaly or masses felt. Normal bowel sounds heard. Drain noted on the right side with thick whitish purulent fluid in the bag about 60 cc Central nervous system: Alert and oriented. No focal neurological deficits. Extremities: Symmetric 5 x 5 power.  Limited range of motion of her right hip. Skin: No rashes, lesions or ulcers Psychiatry: Judgement and insight appear normal. Mood & affect appropriate.     Data Reviewed:   CBC: Recent Labs  Lab 02/07/17 1739 02/08/17 0616 02/09/17 0607  WBC 17.4* 15.7* 13.8*  NEUTROABS 14.9* 13.3*  --   HGB 8.9* 8.5* 8.1*  HCT 27.8* 26.1* 25.3*  MCV 85.3 85.6 86.9  PLT 719* 637* 638*   Basic Metabolic Panel: Recent Labs  Lab 02/07/17 1739 02/08/17 0616 02/09/17 0607  NA 132* 138 137  K 2.9* 3.7 2.8*  CL 92* 103 106  CO2 27 26 24   GLUCOSE 122* 114* 112*  BUN 7 5* <5*  CREATININE 0.65 0.50 0.52  CALCIUM 8.2* 8.0* 7.5*  MG  --  2.1 1.9   GFR: Estimated Creatinine Clearance: 82.9 mL/min (by C-G formula based on SCr of 0.52 mg/dL). Liver Function Tests: Recent Labs  Lab 02/07/17 1739  AST 27  ALT 27  ALKPHOS 124  BILITOT 0.4  PROT 7.1  ALBUMIN 2.3*   No results for input(s): LIPASE, AMYLASE in the last 168 hours. No results for input(s): AMMONIA in the last 168 hours. Coagulation Profile: Recent Labs    Lab 02/08/17 0841  INR 1.24   Cardiac Enzymes: No results for input(s): CKTOTAL, CKMB, CKMBINDEX, TROPONINI in the last 168 hours. BNP (last 3 results) No results for input(s): PROBNP in the last 8760 hours. HbA1C: No results for input(s): HGBA1C in the last 72 hours. CBG: No results for input(s): GLUCAP in the last 168 hours. Lipid Profile: No results for input(s): CHOL, HDL, LDLCALC, TRIG, CHOLHDL, LDLDIRECT in the last 72 hours. Thyroid Function Tests: No results for input(s): TSH, T4TOTAL, FREET4, T3FREE, THYROIDAB in the last 72 hours. Anemia Panel: No results for input(s): VITAMINB12, FOLATE, FERRITIN, TIBC, IRON, RETICCTPCT in the last 72 hours. Sepsis Labs: Recent Labs  Lab 02/07/17 1947  LATICACIDVEN 1.1    Recent Results (from the past 240 hour(s))  Culture, blood (routine x 2)     Status: None (Preliminary result)   Collection Time: 02/08/17 11:06 AM  Result Value Ref Range Status   Specimen Description   Final    BLOOD RIGHT ARM Performed at [facility], [address] [place], [place] [address]    Special Requests   Final    BOTTLES DRAWN AEROBIC AND ANAEROBIC Blood Culture adequate volume Performed at [facility], [address] [place], [place] [address]    Culture   Final    NO GROWTH < 24 HOURS Performed at [facility], [address] [place], [place] [address]    Report Status PENDING  Incomplete  Culture, blood (routine x 2)     Status: None (Preliminary result)   Collection Time: 02/08/17 11:06 AM  Result Value Ref Range Status   Specimen Description   Final    BLOOD LEFT HAND Performed at [facility], [address] [place], [place] [address]    Special Requests   Final    IN PEDIATRIC BOTTLE Blood Culture adequate volume Performed at [facility], [address] [place], [place] [address]    Culture   Final    NO GROWTH < 24 HOURS Performed at [facility], [address] [place], [place] [address]    Report Status PENDING  Incomplete  Aerobic/Anaerobic Culture (surgical/deep wound)     Status: None (Preliminary result)   Collection Time: 02/08/17  5:07 PM  Result Value Ref Range Status   Specimen Description   Final    ABSCESS BACK RIGHT LOWER Performed at [facility], [address] [place], [place] [address]    Special Requests   Final    NONE Performed at [facility], [address] [place], [place] [address]    Gram Stain   Final    ABUNDANT WBC PRESENT, PREDOMINANTLY PMN NO ORGANISMS SEEN    Culture   Final    TOO YOUNG TO READ Performed at [facility], [address], [place], [place] [address]    Report Status PENDING  Incomplete         Radiology Studies: Ct Image Guided Drainage By Percutaneous Catheter  Result Date: 02/08/2017 CLINICAL DATA:  Right psoas abscess EXAM: CT GUIDED DRAINAGE OF PSOAS ABSCESS ANESTHESIA/SEDATION: Intravenous Fentanyl and Versed were administered as conscious sedation during continuous monitoring of the patient's level of consciousness and physiological / cardiorespiratory status by the radiology RN, with a total moderate sedation time of 14 minutes. PROCEDURE: The procedure, risks, benefits, and alternatives were explained to the patient. Questions regarding the procedure were encouraged and answered. The patient understands and consents to the procedure. Patient placed prone. Select axial scans through the mid abdomen obtained. The collection was localized and an appropriate skin entry site was determined and marked. The operative field was prepped with chlorhexidinein a sterile fashion, and a sterile drape was applied covering the operative field. A sterile gown and sterile gloves were used for the procedure. Local anesthesia was provided with 1% Lidocaine. Under CT fluoroscopic guidance, a 19 gauge percutaneous entry needle was  advanced into the collection. Green purulent thick material could be aspirated. An Amplatz guidewire advanced easily within the collection, its position confirmed on CT to facilitate placement of a 12 French pigtail catheter, formed centrally within the collection. CT confirms appropriate positioning. The catheter was secured externally with 0 Prolene suture and StatLock and placed to gravity drain bag. 20 mL purulent aspirate sent for Gram stain and culture. The patient tolerated the procedure well. COMPLICATIONS: None immediate FINDINGS: Loculated large right psoas collection was again localized. 12 French drain catheter placed as above. Sample of the aspirate sent for Gram stain and culture. IMPRESSION: 1. Technically successful CT-guided right psoas retroperitoneal abscess drain catheter placement. Electronically Signed   By: Corlis Leak M.D.   On: 02/08/2017 18:13        Scheduled Meds: . potassium chloride  40 mEq Oral Once  . sodium chloride flush  5 mL Intravenous Q8H   Continuous Infusions: . sodium chloride 150 mL/hr at 02/09/17 1109  . cefTRIAXone (ROCEPHIN)  IV Stopped (02/08/17 2324)  . vancomycin Stopped (02/09/17 0848)     LOS: 2 days    Time spent: 30 mins     Ankit Joline Maxcy, MD [facility] Pager [phone]   If 7PM-7AM, please contact night-coverage [email or web address] Password [email or web address] 02/09/2017, 1:29 PM

## 2017-02-09 NOTE — Progress Notes (Signed)
[id number]/Ihan Pat,BSN,RN3,CCM/[phone]/pam chandler with [facility] notified of furture need for home iv abx.

## 2017-02-09 NOTE — Progress Notes (Signed)
PHARMACY CONSULT NOTE FOR:  OUTPATIENT  PARENTERAL ANTIBIOTIC THERAPY (OPAT)  Indication: L2-3 discitis complicated by right psoas abscess Regimen: Rocephin 2gm IV q24h + Vancomycin 750mg  IV q12h End date: March 23, 2017  IV antibiotic discharge orders are pended. To discharging provider:  please sign these orders via discharge navigator,  Select New Orders & click on the button choice - Manage This Unsigned Work.     Thank you for allowing pharmacy to be a part of this patient's care.  Joy Morris, Joy Morris 02/09/2017, 1:06 PM

## 2017-02-09 NOTE — Progress Notes (Signed)
Peripherally Inserted Central Catheter/Midline Placement  The IV Nurse has discussed with the patient and/or persons authorized to consent for the patient, the purpose of this procedure and the potential benefits and risks involved with this procedure.  The benefits include less needle sticks, lab draws from the catheter, and the patient may be discharged home with the catheter. Risks include, but not limited to, infection, bleeding, blood clot (thrombus formation), and puncture of an artery; nerve damage and irregular heartbeat and possibility to perform a PICC exchange if needed/ordered by physician.  Alternatives to this procedure were also discussed.  Bard Power PICC patient education guide, fact sheet on infection prevention and patient information card has been provided to patient /or left at bedside.    PICC/Midline Placement Documentation  PICC Single Lumen 02/09/17 PICC Right Basilic 36 cm 0 cm (Active)  Indication for Insertion or Continuance of Line Home intravenous therapies (PICC only) 02/09/2017  6:50 PM  Exposed Catheter (cm) 0 cm 02/09/2017  6:50 PM  Site Assessment Clean;Dry;Intact 02/09/2017  6:50 PM  Line Status Flushed;Blood return noted;Saline locked 02/09/2017  6:50 PM  Dressing Type Transparent 02/09/2017  6:50 PM  Dressing Status Clean;Dry;Intact;Antimicrobial disc in place 02/09/2017  6:50 PM  Dressing Change Due 02/16/17 02/09/2017  6:50 PM       Audrie GallusByerly, Keigen Caddell Ramos 02/09/2017, 7:11 PM

## 2017-02-09 NOTE — Progress Notes (Signed)
Referring Physician(s): Hatcher,J/Amin,A  Supervising Physician: Malachy Moan  Patient Status:  [facility] - In-pt  Chief Complaint:  Right psoas abscess  Subjective: Patient states that her right lateral abdomen/flank discomfort has improved since drainage procedure yesterday; continues to have some paresthesias and discomfort down right lower extremity   Allergies: Patient has no known allergies.  Medications: Prior to Admission medications   Medication Sig Start Date End Date Taking? Authorizing Provider  acetaminophen-codeine (TYLENOL #3) 300-30 MG tablet Take 1 tablet by mouth every 6 hours prn for pain   Yes [provider]  fluticasone (FLONASE) 50 MCG/ACT nasal spray Place 1 spray into both nostrils daily.   Yes [provider]  methocarbamol (ROBAXIN) 500 MG tablet TK 2 TS PO Q 6 TO 8 H PRN FOR INFLAMMATION AND PAIN 01/08/17  Yes [provider]  meloxicam (MOBIC) 15 MG tablet meloxicam 15 mg tablet    [provider]     Vital Signs: BP (!) 114/58 (BP Location: Right Arm)   Pulse 98   Temp 98.7 F (37.1 C) (Oral)   Resp 14   Ht 5\' 1"  (1.549 m)   Wt 160 lb 14.4 oz (73 kg)   LMP 08/16/2015 (Approximate)   SpO2 100%   BMI 30.40 kg/m   Physical Exam right flank drain intact, insertion site okay, site mildly tender, output 60 cc purulent beige colored fluid.  Imaging: Ct Image Guided Drainage By Percutaneous Catheter  Result Date: 02/08/2017 CLINICAL DATA:  Right psoas abscess EXAM: CT GUIDED DRAINAGE OF PSOAS ABSCESS ANESTHESIA/SEDATION: Intravenous Fentanyl and Versed were administered as conscious sedation during continuous monitoring of the patient's level of consciousness and physiological / cardiorespiratory status by the radiology RN, with a total moderate sedation time of 14 minutes. PROCEDURE: The procedure, risks, benefits, and alternatives were explained to the patient. Questions regarding the procedure were encouraged  and answered. The patient understands and consents to the procedure. Patient placed prone. Select axial scans through the mid abdomen obtained. The collection was localized and an appropriate skin entry site was determined and marked. The operative field was prepped with chlorhexidinein a sterile fashion, and a sterile drape was applied covering the operative field. A sterile gown and sterile gloves were used for the procedure. Local anesthesia was provided with 1% Lidocaine. Under CT fluoroscopic guidance, a 19 gauge percutaneous entry needle was advanced into the collection. Green purulent thick material could be aspirated. An Amplatz guidewire advanced easily within the collection, its position confirmed on CT to facilitate placement of a 12 French pigtail catheter, formed centrally within the collection. CT confirms appropriate positioning. The catheter was secured externally with 0 Prolene suture and StatLock and placed to gravity drain bag. 20 mL purulent aspirate sent for Gram stain and culture. The patient tolerated the procedure well. COMPLICATIONS: None immediate FINDINGS: Loculated large right psoas collection was again localized. 12 French drain catheter placed as above. Sample of the aspirate sent for Gram stain and culture. IMPRESSION: 1. Technically successful CT-guided right psoas retroperitoneal abscess drain catheter placement. Electronically Signed   By: Corlis Leak M.D.   On: 02/08/2017 18:13    Labs:  CBC: Recent Labs    02/07/17 1739 02/08/17 0616 02/09/17 0607  WBC 17.4* 15.7* 13.8*  HGB 8.9* 8.5* 8.1*  HCT 27.8* 26.1* 25.3*  PLT 719* 637* 638*    COAGS: Recent Labs    02/08/17 0841  INR 1.24    BMP: Recent Labs    02/07/17 1739  02/08/17 0616 02/09/17 0607  NA 132* 138 137  K 2.9* 3.7 2.8*  CL 92* 103 106  CO2 27 26 24   GLUCOSE 122* 114* 112*  BUN 7 5* <5*  CALCIUM 8.2* 8.0* 7.5*  CREATININE 0.65 0.50 0.52  GFRNONAA >60 >60 >60  GFRAA >60 >60 >60     LIVER FUNCTION TESTS: Recent Labs    02/07/17 1739  BILITOT 0.4  AST 27  ALT 27  ALKPHOS 124  PROT 7.1  ALBUMIN 2.3*    Assessment and Plan: Status post percutaneous drainage of right psoas abscess on 02/08/17; currently afebrile, WBC 13.8 (15.7), hemoglobin 8.1, creatinine normal, potassium 2.8-replace, calcium 7.5-replenish; drain fluid cultures pending-check sensitivities.  Recommend drain irrigation with 5-10 cc sterile normal saline every 8 hours while inpatient, output monitoring and dressing changes as needed.  Recommend follow-up CT within 1 week of drain placement either as IP or at IR drain clinic [phone]).   Electronically Signed: D. Jeananne RamaKevin Allred, PA-C 02/09/2017, 9:40 AM   I spent a total of 15 minutes at the the patient's bedside AND on the patient's hospital floor or unit, greater than 50% of which was counseling/coordinating care for right psoas abscess drain    Patient ID: Joy Morris, female   DOB: 08-30-1975, 42 y.o.   MRN: [id number]

## 2017-02-09 NOTE — Progress Notes (Signed)
Patient ID: Joy Morris, female   DOB: 1975-01-19, 42 y.o.   MRN: [id number]          [facility]  Date of Admission:  02/07/2017           Day 2 vancomycin        Day 2 ceftriaxone ASSESSMENT: She has L2-3 discitis complicated by right psoas abscess.  He appears to have defervesced with her initial antibiotic therapy.  Blood cultures are negative.  Abscess Gram stain shows white blood cells but no organisms.  Cultures are pending.  We will go ahead and order a PICC in anticipation of 6 weeks of outpatient IV antibiotic therapy.  PLAN: 1. PICC placement 2. Continue current antibiotics pending final culture results  Principal Problem:   Psoas abscess, right (HCC)   Scheduled Meds: . potassium chloride  40 mEq Oral Once  . potassium chloride  40 mEq Oral Once  . sodium chloride flush  5 mL Intravenous Q8H   Continuous Infusions: . sodium chloride 150 mL/hr at 02/09/17 0503  . cefTRIAXone (ROCEPHIN)  IV Stopped (02/08/17 2324)  . vancomycin 750 mg (02/09/17 0748)   PRN Meds:.acetaminophen, feeding supplement (ENSURE ENLIVE), fentaNYL, HYDROcodone-acetaminophen, lidocaine, midazolam, morphine injection   SUBJECTIVE: He is feeling a little bit better today.  Back pain at 7 out of 10.  She has been up walking in her room.  She had a drain placed yesterday in her right psoas abscess.  Review of Systems: Review of Systems  Constitutional: Positive for weight loss. Negative for chills, diaphoresis and fever.  Gastrointestinal: Negative for abdominal pain, diarrhea, nausea and vomiting.  Musculoskeletal: Positive for back pain.    No Known Allergies  OBJECTIVE: Vitals:   02/08/17 1831 02/08/17 1853 02/08/17 2052 02/09/17 0458  BP: (!) 103/44 117/61 (!) 123/59 (!) 114/58  Pulse: (!) 118 (!) 113 (!) 119 98  Resp: 18 16 17 14   Temp:  99.1 F (37.3 C) 97.8 F (36.6 C) 98.7 F (37.1 C)  TempSrc:  Axillary Oral Oral  SpO2: 92% 98% 96% 100%  Weight:       Height:       Body mass index is 30.4 kg/m.  Physical Exam  Constitutional: She is oriented to person, place, and time.  He is resting quietly in bed.  He is in good spirits.  Cardiovascular: Normal rate and regular rhythm.  No murmur heard. Pulmonary/Chest: Effort normal. She has no wheezes. She has no rales.  Abdominal: Soft. She exhibits no distension. There is no tenderness.  Thick green fluid in drain bag.  Recorded drain output 62 cc.  Neurological: She is alert and oriented to person, place, and time.  Skin: No rash noted.  Psychiatric: Mood and affect normal.    Lab Results Lab Results  Component Value Date   WBC 13.8 (H) 02/09/2017   HGB 8.1 (L) 02/09/2017   HCT 25.3 (L) 02/09/2017   MCV 86.9 02/09/2017   PLT 638 (H) 02/09/2017    Lab Results  Component Value Date   CREATININE 0.52 02/09/2017   BUN <5 (L) 02/09/2017   NA 137 02/09/2017   K 2.8 (L) 02/09/2017   CL 106 02/09/2017   CO2 24 02/09/2017    Lab Results  Component Value Date   ALT 27 02/07/2017   AST 27 02/07/2017   ALKPHOS 124 02/07/2017   BILITOT 0.4 02/07/2017     Microbiology: Recent Results (from the past 240 hour(s))  Aerobic/Anaerobic Culture (surgical/deep  wound)     Status: None (Preliminary result)   Collection Time: 02/08/17  5:07 PM  Result Value Ref Range Status   Specimen Description   Final    ABSCESS BACK RIGHT LOWER Performed at [facility], [address] [place], [place] [address]    Special Requests   Final    NONE Performed at [facility], [address] [place], [place] [address]    Gram Stain   Final    ABUNDANT WBC PRESENT, PREDOMINANTLY PMN NO ORGANISMS SEEN Performed at [facility], [address] [place], [place] [address]    Culture PENDING  Incomplete   Report Status PENDING  Incomplete    Cliffton AstersJohn Tylik Treese, MD [facility] [phone] pager   [phone]  [phone] cell 02/09/2017, 9:49 AM

## 2017-02-10 DIAGNOSIS — R531 Weakness: Secondary | ICD-10-CM

## 2017-02-10 DIAGNOSIS — R109 Unspecified abdominal pain: Secondary | ICD-10-CM

## 2017-02-10 DIAGNOSIS — M79604 Pain in right leg: Secondary | ICD-10-CM

## 2017-02-10 DIAGNOSIS — Z95828 Presence of other vascular implants and grafts: Secondary | ICD-10-CM

## 2017-02-10 LAB — MAGNESIUM: MAGNESIUM: 1.6 mg/dL — AB (ref 1.7–2.4)

## 2017-02-10 LAB — BASIC METABOLIC PANEL
Anion gap: 6 (ref 5–15)
CALCIUM: 7.6 mg/dL — AB (ref 8.9–10.3)
CO2: 23 mmol/L (ref 22–32)
CREATININE: 0.48 mg/dL (ref 0.44–1.00)
Chloride: 106 mmol/L (ref 101–111)
Glucose, Bld: 109 mg/dL — ABNORMAL HIGH (ref 65–99)
Potassium: 3.2 mmol/L — ABNORMAL LOW (ref 3.5–5.1)
SODIUM: 135 mmol/L (ref 135–145)

## 2017-02-10 LAB — CBC
HCT: 25.4 % — ABNORMAL LOW (ref 36.0–46.0)
Hemoglobin: 8 g/dL — ABNORMAL LOW (ref 12.0–15.0)
MCH: 27.5 pg (ref 26.0–34.0)
MCHC: 31.5 g/dL (ref 30.0–36.0)
MCV: 87.3 fL (ref 78.0–100.0)
PLATELETS: 599 10*3/uL — AB (ref 150–400)
RBC: 2.91 MIL/uL — ABNORMAL LOW (ref 3.87–5.11)
RDW: 14.9 % (ref 11.5–15.5)
WBC: 15 10*3/uL — AB (ref 4.0–10.5)

## 2017-02-10 LAB — VANCOMYCIN, PEAK: Vancomycin Pk: 26 ug/mL — ABNORMAL LOW (ref 30–40)

## 2017-02-10 MED ORDER — POTASSIUM CHLORIDE CRYS ER 20 MEQ PO TBCR
40.0000 meq | EXTENDED_RELEASE_TABLET | Freq: Once | ORAL | Status: AC
  Administered 2017-02-10: 40 meq via ORAL
  Filled 2017-02-10: qty 2

## 2017-02-10 MED ORDER — POTASSIUM CHLORIDE CRYS ER 20 MEQ PO TBCR
40.0000 meq | EXTENDED_RELEASE_TABLET | Freq: Once | ORAL | Status: AC
Start: 2017-02-10 — End: 2017-02-10
  Administered 2017-02-10: 12:00:00 40 meq via ORAL
  Filled 2017-02-10: qty 2

## 2017-02-10 NOTE — Progress Notes (Signed)
Patient ID: Joy Morris, female   DOB: 1975-02-06, 42 y.o.   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  She is improving on empiric antibiotics.  Her PICC has been placed.  So far abscess cultures are pending.  Blood cultures are negative.  PLAN: 1. Continue current antibiotics pending final culture results 2. I will follow-up tomorrow  Principal Problem:   Psoas abscess, right (HCC)   Scheduled Meds: . potassium chloride  40 mEq Oral Once  . sodium chloride flush  5 mL Intravenous Q8H   Continuous Infusions: . sodium chloride 150 mL/hr at 02/10/17 1129  . cefTRIAXone (ROCEPHIN)  IV Stopped (02/09/17 2258)  . vancomycin Stopped (02/10/17 1130)   PRN Meds:.acetaminophen, feeding supplement (ENSURE ENLIVE), fentaNYL, lidocaine, midazolam, morphine injection, oxyCODONE, sodium chloride flush   SUBJECTIVE: She is feeling better.  Her lower abdominal and right leg pain have improved.  She still has back pain but feels like much of that is due to her drain.  She feels like the numbness and slight weakness in her right leg are improving.  She does not feel like her right leg is going to buckle when she is walking in her room.  Review of Systems: Review of Systems  Constitutional: Positive for fever and weight loss. Negative for chills and diaphoresis.  Gastrointestinal: Positive for abdominal pain. Negative for diarrhea, nausea and vomiting.       Drain output has been 45 cc in the past 24 hours.  There is still purulent, green drainage in the bag.  Musculoskeletal: Positive for back pain.  Neurological: Positive for sensory change and focal weakness. Negative for headaches.    No Known Allergies  OBJECTIVE: Vitals:   02/10/17 0004 02/10/17 0448 02/10/17 0452 02/10/17 0758  BP:  128/61    Pulse:  (!)  116    Resp:  13    Temp: (!) 100.6 F (38.1 C) 100.1 F (37.8 C) 98.6 F (37 C) 98.7 F (37.1 C)  TempSrc: Axillary Oral Oral   SpO2:  97%    Weight:      Height:       Body mass index is 30.4 kg/m.  Physical Exam  Constitutional: She is oriented to person, place, and time.  He is resting quietly in bed.  He is in good spirits.  Cardiovascular: Normal rate and regular rhythm.  No murmur heard. Pulmonary/Chest: Effort normal. She has no wheezes. She has no rales.  Abdominal: Soft. She exhibits no distension. There is no tenderness.  Thick green fluid in drain bag.  Recorded drain output 62 cc.  Neurological: She is alert and oriented to person, place, and time.  Skin: No rash noted.  Psychiatric: Mood and affect normal.    Lab Results Lab Results  Component Value Date   WBC 15.0 (H) 02/10/2017   HGB 8.0 (L) 02/10/2017   HCT 25.4 (L) 02/10/2017   MCV 87.3 02/10/2017   PLT 599 (H) 02/10/2017    Lab Results  Component Value Date   CREATININE 0.48 02/10/2017   BUN <5 (L) 02/10/2017   NA 135 02/10/2017   K 3.2 (L) 02/10/2017   CL 106 02/10/2017   CO2 23 02/10/2017    Lab Results  Component Value Date   ALT 27 02/07/2017   AST 27 02/07/2017   ALKPHOS 124 02/07/2017   BILITOT 0.4 02/07/2017     Microbiology: Recent Results (from the past 240 hour(s))  Culture, blood (routine x 2)     Status: None (Preliminary result)   Collection Time: 02/08/17 11:06 AM  Result Value Ref Range Status   Specimen Description   Final    BLOOD RIGHT ARM Performed at [facility], [address] [place], [place] [address]    Special Requests   Final    BOTTLES DRAWN AEROBIC AND ANAEROBIC Blood Culture adequate volume Performed at [facility], [address] [place], [place] [address]    Culture   Final    NO GROWTH 2 DAYS Performed at [facility], [address] [place], [place] [address]    Report Status PENDING  Incomplete    Culture, blood (routine x 2)     Status: None (Preliminary result)   Collection Time: 02/08/17 11:06 AM  Result Value Ref Range Status   Specimen Description   Final    BLOOD LEFT HAND Performed at [facility], [address] [place], [place] [address]    Special Requests   Final    IN PEDIATRIC BOTTLE Blood Culture adequate volume Performed at [facility], [address] [place], [place] [address]    Culture   Final    NO GROWTH 2 DAYS Performed at [facility], [address] [place], [place] [address]    Report Status PENDING  Incomplete  Aerobic/Anaerobic Culture (surgical/deep wound)     Status: None (Preliminary result)   Collection Time: 02/08/17  5:07 PM  Result Value Ref Range Status   Specimen Description   Final    ABSCESS BACK RIGHT LOWER Performed at [facility], [address] [place], [place] [address]    Special Requests   Final    NONE Performed at [facility], [address] [place], [place] [address]    Gram Stain   Final    ABUNDANT WBC PRESENT, PREDOMINANTLY PMN NO ORGANISMS SEEN    Culture   Final    TOO YOUNG TO READ Performed at [facility], [address] [place], [place] [address]    Report Status PENDING  Incomplete    Cliffton AstersJohn Tykisha Areola, MD [facility] [phone] pager   [phone] cell 02/10/2017, 12:05 PM

## 2017-02-10 NOTE — Progress Notes (Signed)
Pharmacy Antibiotic Note  Joy Morris is a 42 y.o. female admitted on 02/07/2017 with discitis, osteomyelitis L2-3 with large R iliopsoas abscess.  She received Rocephin 1gm & Vancomycin 1gm x1 in ED; Pharmacy has been consulted for Vancomycin dosing, continue Ceftriaxone per MD.  Plan:  Rocephin 2gm IV q24h Increase to Vancomycin 1g IV q12h  Measure Vanc trough at steady state. Follow up renal fxn, culture results, and clinical course.  Height: 5\' 1"  (154.9 cm) Weight: 160 lb 14.4 oz (73 kg) IBW/kg (Calculated) : 47.8  Temp (24hrs), Avg:99.6 F (37.6 C), Min:97.6 F (36.4 C), Max:102.1 F (38.9 C)  Recent Labs  Lab 02/07/17 1739 02/07/17 1947 02/08/17 0616 02/09/17 0607 02/10/17 0852  WBC 17.4*  --  15.7* 13.8* 15.0*  CREATININE 0.65  --  0.50 0.52 0.48  LATICACIDVEN  --  1.1  --   --   --     Estimated Creatinine Clearance: 82.9 mL/min (by C-G formula based on SCr of 0.48 mg/dL).    No Known Allergies  Antimicrobials this admission: 2/6 Rocephin >>  2/6 Vanc >>   Dose adjustments this admission: 2/9 2145 Vanc peak: 26 2/10 0658 Vanc trough: 8, Calculated AUC 374, t1/2 5.4 hrs   Microbiology results: 2/7 BCx: ngtd 2/7 abscess: pending  Thank you for allowing pharmacy to be a part of this patient's care.  Lynann Beaverhristine Zendayah Hardgrave PharmD, BCPS Pager [phone] 02/10/2017 12:36 PM

## 2017-02-10 NOTE — Progress Notes (Signed)
PROGRESS NOTE    Joy Morris  ZOX:[id number] DOB: 1975-08-17 DOA: 02/07/2017 PCP: Patient, No Pcp Per   Brief Narrative:   42 year old female with no significant past medical history does not take any medications at home has been dealing with right-sided hip pain and lower back pain for the past 5-6 weeks.  At first she was seen in the ER and was treated with pain medications but later because her pain did not improve she was seen by orthopedic who ordered an MRI and was noted to have right iliopsoas abscess along with osteomyelitis and discitis of L2-L3.  She was sent to the hospital for further evaluation, IV antibiotics and drainage of her abscess.  Patient underwent drainage placement on 2/7.  IV antibiotics vancomycin and Rocephin were started.  Patient has a PICC line in place in the right arm.  Assessment & Plan:   Principal Problem:   Psoas abscess, right (HCC)   Right hip pain Right iliopsoas abscess status post drain placement 2/7 Osteomyelitis of L3-L4 -Patient is currently status post drain placement 2/7.  It continues to drain, currently 30 cc in the back.  Was drained earlier today. -Continue vancomycin and Rocephin day 3 -Infectious disease following -PICC line placed in the right arm on 2/8 -Pain control, IV fluids as necessary  Hypokalemia -Repletion ordered     DVT prophylaxis: SCDs Code Status: Full code Family Communication: None Disposition Plan: Maintain inpatient stay  Consultants:  Interventional radiology Infectious disease  Procedures:   CT-guided drainage of her abscess 2/7  Antimicrobials:   Vancomycin February 08, 2017  Rocephin February 08, 2017   Subjective: Patient was febrile overnight.  Afebrile this morning and does not have any complaints.  Overall she feels better.  Objective: Vitals:   02/10/17 0004 02/10/17 0448 02/10/17 0452 02/10/17 0758  BP:  128/61    Pulse:  (!) 116    Resp:  13    Temp: (!) 100.6 F (38.1 C) 100.1  F (37.8 C) 98.6 F (37 C) 98.7 F (37.1 C)  TempSrc: Axillary Oral Oral   SpO2:  97%    Weight:      Height:        Intake/Output Summary (Last 24 hours) at 02/10/2017 1144 Last data filed at 02/10/2017 0511 Gross per 24 hour  Intake 3427 ml  Output 45 ml  Net 3382 ml   Filed Weights   02/07/17 1700  Weight: 73 kg (160 lb 14.4 oz)    Examination:  General exam: Appears calm and comfortable  Respiratory system: Clear to auscultation. Respiratory effort normal. Cardiovascular system: S1 & S2 heard, RRR. No JVD, murmurs, rubs, gallops or clicks. No pedal edema. Gastrointestinal system: Abdomen is nondistended, soft and nontender. No organomegaly or masses felt. Normal bowel sounds heard. Drain noted on the right side with thick whitish purulent fluid in the bag about 30 cc Central nervous system: Alert and oriented. No focal neurological deficits. Extremities: Symmetric 5 x 5 power.  Limited range of motion of her right hip. Skin: No rashes, lesions or ulcers Psychiatry: Judgement and insight appear normal. Mood & affect appropriate.     Data Reviewed:   CBC: Recent Labs  Lab 02/07/17 1739 02/08/17 0616 02/09/17 0607 02/10/17 0852  WBC 17.4* 15.7* 13.8* 15.0*  NEUTROABS 14.9* 13.3*  --   --   HGB 8.9* 8.5* 8.1* 8.0*  HCT 27.8* 26.1* 25.3* 25.4*  MCV 85.3 85.6 86.9 87.3  PLT 719* 637* 638* 599*   Basic  Metabolic Panel: Recent Labs  Lab 02/07/17 1739 02/08/17 0616 02/09/17 0607 02/10/17 0852  NA 132* 138 137 135  K 2.9* 3.7 2.8* 3.2*  CL 92* 103 106 106  CO2 27 26 24 23   GLUCOSE 122* 114* 112* 109*  BUN 7 5* <5* <5*  CREATININE 0.65 0.50 0.52 0.48  CALCIUM 8.2* 8.0* 7.5* 7.6*  MG  --  2.1 1.9 1.6*   GFR: Estimated Creatinine Clearance: 82.9 mL/min (by C-G formula based on SCr of 0.48 mg/dL). Liver Function Tests: Recent Labs  Lab 02/07/17 1739  AST 27  ALT 27  ALKPHOS 124  BILITOT 0.4  PROT 7.1  ALBUMIN 2.3*   No results for input(s): LIPASE,  AMYLASE in the last 168 hours. No results for input(s): AMMONIA in the last 168 hours. Coagulation Profile: Recent Labs  Lab 02/08/17 0841  INR 1.24   Cardiac Enzymes: No results for input(s): CKTOTAL, CKMB, CKMBINDEX, TROPONINI in the last 168 hours. BNP (last 3 results) No results for input(s): PROBNP in the last 8760 hours. HbA1C: No results for input(s): HGBA1C in the last 72 hours. CBG: No results for input(s): GLUCAP in the last 168 hours. Lipid Profile: No results for input(s): CHOL, HDL, LDLCALC, TRIG, CHOLHDL, LDLDIRECT in the last 72 hours. Thyroid Function Tests: No results for input(s): TSH, T4TOTAL, FREET4, T3FREE, THYROIDAB in the last 72 hours. Anemia Panel: No results for input(s): VITAMINB12, FOLATE, FERRITIN, TIBC, IRON, RETICCTPCT in the last 72 hours. Sepsis Labs: Recent Labs  Lab 02/07/17 1947  LATICACIDVEN 1.1    Recent Results (from the past 240 hour(s))  Culture, blood (routine x 2)     Status: None (Preliminary result)   Collection Time: 02/08/17 11:06 AM  Result Value Ref Range Status   Specimen Description   Final    BLOOD RIGHT ARM Performed at [facility], [address] [place], [place] [address]    Special Requests   Final    BOTTLES DRAWN AEROBIC AND ANAEROBIC Blood Culture adequate volume Performed at [facility], [address] [place], [place] [address]    Culture   Final    NO GROWTH 2 DAYS Performed at [facility], [address] [place], [place] [address]    Report Status PENDING  Incomplete  Culture, blood (routine x 2)     Status: None (Preliminary result)   Collection Time: 02/08/17 11:06 AM  Result Value Ref Range Status   Specimen Description   Final    BLOOD LEFT HAND Performed at [facility], [address] [place], [place] [address]    Special Requests   Final    IN PEDIATRIC BOTTLE Blood Culture adequate volume Performed at [facility], [address] [place], [place] [address]    Culture   Final    NO GROWTH 2 DAYS Performed at [facility], [address] [place], [place] [address]    Report Status PENDING  Incomplete  Aerobic/Anaerobic Culture (surgical/deep wound)     Status: None (Preliminary result)   Collection Time: 02/08/17  5:07 PM  Result Value Ref Range Status   Specimen Description   Final    ABSCESS BACK RIGHT LOWER Performed at [facility], [address] [place], [place] [address]    Special Requests   Final    NONE Performed at [facility], [address] [place], [place] [address]    Gram Stain  Final    ABUNDANT WBC PRESENT, PREDOMINANTLY PMN NO ORGANISMS SEEN    Culture   Final    TOO YOUNG TO READ Performed at [facility], [address], [place], [place] [address]    Report Status PENDING  Incomplete         Radiology Studies: Ct Image Guided Drainage By Percutaneous Catheter  Result Date: 02/08/2017 CLINICAL DATA:  Right psoas abscess EXAM: CT GUIDED DRAINAGE OF PSOAS ABSCESS ANESTHESIA/SEDATION: Intravenous Fentanyl and Versed were administered as conscious sedation during continuous monitoring of the patient's level of consciousness and physiological / cardiorespiratory status by the radiology RN, with a total moderate sedation time of 14 minutes. PROCEDURE: The procedure, risks, benefits, and alternatives were explained to the patient. Questions regarding the procedure were encouraged and answered. The patient understands and consents to the procedure. Patient placed prone. Select axial scans through the mid abdomen obtained. The collection was localized and an appropriate skin entry site was determined and marked. The operative field was prepped with chlorhexidinein a sterile fashion, and a sterile drape was applied covering the operative field. A sterile gown and sterile gloves were used for the procedure. Local  anesthesia was provided with 1% Lidocaine. Under CT fluoroscopic guidance, a 19 gauge percutaneous entry needle was advanced into the collection. Green purulent thick material could be aspirated. An Amplatz guidewire advanced easily within the collection, its position confirmed on CT to facilitate placement of a 12 French pigtail catheter, formed centrally within the collection. CT confirms appropriate positioning. The catheter was secured externally with 0 Prolene suture and StatLock and placed to gravity drain bag. 20 mL purulent aspirate sent for Gram stain and culture. The patient tolerated the procedure well. COMPLICATIONS: None immediate FINDINGS: Loculated large right psoas collection was again localized. 12 French drain catheter placed as above. Sample of the aspirate sent for Gram stain and culture. IMPRESSION: 1. Technically successful CT-guided right psoas retroperitoneal abscess drain catheter placement. Electronically Signed   By: Corlis Leak M.D.   On: 02/08/2017 18:13        Scheduled Meds: . potassium chloride  40 mEq Oral Once  . sodium chloride flush  5 mL Intravenous Q8H   Continuous Infusions: . sodium chloride 150 mL/hr at 02/10/17 1129  . cefTRIAXone (ROCEPHIN)  IV Stopped (02/09/17 2258)  . vancomycin Stopped (02/10/17 1130)     LOS: 3 days    Time spent: 25 mins     Phillip Maffei Joline Maxcy, MD [facility] Pager [phone]   If 7PM-7AM, please contact night-coverage [email or web address] Password [email or web address] 02/10/2017, 11:44 AM

## 2017-02-11 LAB — CBC
HCT: 25.3 % — ABNORMAL LOW (ref 36.0–46.0)
Hemoglobin: 8.1 g/dL — ABNORMAL LOW (ref 12.0–15.0)
MCH: 27.9 pg (ref 26.0–34.0)
MCHC: 32 g/dL (ref 30.0–36.0)
MCV: 87.2 fL (ref 78.0–100.0)
PLATELETS: 587 10*3/uL — AB (ref 150–400)
RBC: 2.9 MIL/uL — AB (ref 3.87–5.11)
RDW: 14.9 % (ref 11.5–15.5)
WBC: 14.8 10*3/uL — AB (ref 4.0–10.5)

## 2017-02-11 LAB — MAGNESIUM: MAGNESIUM: 1.6 mg/dL — AB (ref 1.7–2.4)

## 2017-02-11 LAB — BASIC METABOLIC PANEL
ANION GAP: 9 (ref 5–15)
BUN: <5 mg/dL — ABNORMAL LOW (ref 6–20)
CALCIUM: 7.7 mg/dL — AB (ref 8.9–10.3)
CO2: 22 mmol/L (ref 22–32)
Chloride: 102 mmol/L (ref 101–111)
Creatinine, Ser: 0.47 mg/dL (ref 0.44–1.00)
Glucose, Bld: 107 mg/dL — ABNORMAL HIGH (ref 65–99)
Potassium: 3.3 mmol/L — ABNORMAL LOW (ref 3.5–5.1)
SODIUM: 133 mmol/L — AB (ref 135–145)

## 2017-02-11 LAB — VANCOMYCIN, TROUGH: VANCOMYCIN TR: 8 ug/mL — AB (ref 15–20)

## 2017-02-11 MED ORDER — VANCOMYCIN HCL IN DEXTROSE 1-5 GM/200ML-% IV SOLN
1000.0000 mg | Freq: Two times a day (BID) | INTRAVENOUS | Status: DC
  Administered 2017-02-11: 1000 mg via INTRAVENOUS
  Filled 2017-02-11: qty 200

## 2017-02-11 NOTE — Progress Notes (Signed)
Patient ID: Joy Morris, female   DOB: [birth year], 42 y.o.   MRN: [id number]         [facility] for Infectious Disease  Date of Admission:  02/07/2017           Day 4 vancomycin        Day 4 ceftriaxone ASSESSMENT: She has I5-0 discitis complicated by right psoas abscess.  She is improving on empiric antibiotics.  Admission blood cultures are negative.  There is no need to repeat them again.  Abscess cultures are growing viridans strep.  I will will stop vancomycin and continue ceftriaxone alone pending and ability results.  She should be ready for discharge tomorrow.  We will need to get IR to arrange follow-up in the drain clinic.  PLAN: 1. Continue ceftriaxone pending final antibiotic susceptibility results 2. Discontinue vancomycin 3. Probable discharge home tomorrow  Diagnosis: L2-3 discitis and right psoas abscess  Culture Result: Viridans strep  No Known Allergies  OPAT Orders Discharge antibiotics: Per pharmacy protocol ceftriaxone  Duration: 6 weeks End Date: 03/22/2017  [facility] Care Per Protocol:  Labs weekly while on IV antibiotics: _x_ CBC with differential _x_ BMP __ CMP _x_ CRP _x_ ESR __ Vancomycin trough  __ Please pull PIC at completion of IV antibiotics _x_ Please leave PIC in place until doctor has seen patient or been notified  Fax weekly labs to [phone]  Clinic Follow Up Appt: I will arrange follow-up in my clinic in 3-4 weeks  Principal Problem:   Psoas abscess, right ([place])   Scheduled Meds: . sodium chloride flush  5 mL Intravenous Q8H   Continuous Infusions: . cefTRIAXone (ROCEPHIN)  IV Stopped (02/10/17 2245)   PRN Meds:.acetaminophen, feeding supplement (ENSURE ENLIVE), fentaNYL, lidocaine, midazolam, morphine injection, oxyCODONE, sodium chloride flush   SUBJECTIVE: She had some more fever, chills and sweats last night.  He is having a little more lower abdominal.  Her right leg numbness and weakness is improving  slowly.  Review of Systems: Review of Systems  Constitutional: Positive for chills, diaphoresis, fever and weight loss.  Gastrointestinal: Positive for abdominal pain. Negative for diarrhea, nausea and vomiting.       She continues to have purulent, green drainage from her abscess drained.  She had 140 cc out yesterday and 125 cc so far today.  Musculoskeletal: Positive for back pain.  Neurological: Positive for sensory change and focal weakness. Negative for headaches.    No Known Allergies  OBJECTIVE: Vitals:   02/10/17 1436 02/10/17 2050 02/10/17 2120 02/11/17 0510  BP: 110/61 (!) 116/55  (!) 130/58  Pulse: (!) 112 (!) 116  (!) 114  Resp: 16 20  16   Temp: (!) 100.4 F (38 C) (!) 100.9 F (38.3 C) (!) 101.1 F (38.4 C) 98.9 F (37.2 C)  TempSrc: Axillary Axillary Oral Oral  SpO2: 97% 94%  98%  Weight:      Height:       Body mass index is 30.4 kg/m.  Physical Exam  Constitutional: She is oriented to person, place, and time.  He is resting quietly in bed.  He is in good spirits.  Cardiovascular: Normal rate and regular rhythm.  No murmur heard. Pulmonary/Chest: Effort normal. She has no wheezes. She has no rales.  Abdominal: Soft. She exhibits no distension. There is no tenderness.  Thick green fluid in drain bag.  Recorded drain output 62 cc.  Neurological: She is alert and oriented to person, place, and time.  Skin: No rash  noted.  Psychiatric: Mood and affect normal.    Lab Results Lab Results  Component Value Date   WBC 14.8 (H) 02/11/2017   HGB 8.1 (L) 02/11/2017   HCT 25.3 (L) 02/11/2017   MCV 87.2 02/11/2017   PLT 587 (H) 02/11/2017    Lab Results  Component Value Date   CREATININE 0.47 02/11/2017   BUN <5 (L) 02/11/2017   NA 133 (L) 02/11/2017   K 3.3 (L) 02/11/2017   CL 102 02/11/2017   CO2 22 02/11/2017    Lab Results  Component Value Date   ALT 27 02/07/2017   AST 27 02/07/2017   ALKPHOS 124 02/07/2017   BILITOT 0.4 02/07/2017      Microbiology: Recent Results (from the past 240 hour(s))  Culture, blood (routine x 2)     Status: None (Preliminary result)   Collection Time: 02/08/17 11:06 AM  Result Value Ref Range Status   Specimen Description   Final    BLOOD RIGHT ARM Performed at [place] [address], [place], [place] [address]    Special Requests   Final    BOTTLES DRAWN AEROBIC AND ANAEROBIC Blood Culture adequate volume Performed at [place] [address], [place], [place] [address]    Culture   Final    NO GROWTH 3 DAYS Performed at [facility], [place] [address] [place], [place] [address]    Report Status PENDING  Incomplete  Culture, blood (routine x 2)     Status: None (Preliminary result)   Collection Time: 02/08/17 11:06 AM  Result Value Ref Range Status   Specimen Description   Final    BLOOD LEFT HAND Performed at [place] [address] [place], [place] [address]    Special Requests   Final    IN PEDIATRIC BOTTLE Blood Culture adequate volume Performed at [place] [address], [place], [place] [address]    Culture   Final    NO GROWTH 3 DAYS Performed at [facility], [place] [address], [place], [place] [address]    Report Status PENDING  Incomplete  Aerobic/Anaerobic Culture (surgical/deep wound)     Status: None (Preliminary result)   Collection Time: 02/08/17  5:07 PM  Result Value Ref Range Status   Specimen Description   Final    ABSCESS BACK RIGHT LOWER Performed at [place] [address] [place], [place] [address]    Special Requests   Final    NONE Performed at [facility], [place] [address], [place],  [address]    Gram Stain   Final    ABUNDANT WBC PRESENT, PREDOMINANTLY PMN NO ORGANISMS SEEN Performed at [facility], [place] [address], [place],  [address]    Culture ABUNDANT VIRIDANS  STREPTOCOCCUS  Final   Report Status PENDING  Incomplete    Michel Bickers, MD [facility] [place] [facility] [phone] pager   [phone] cell 02/11/2017, 10:32 AM

## 2017-02-11 NOTE — Progress Notes (Signed)
PROGRESS NOTE    Joy Morris  UUV:[id number] DOB: Jul 10, 1975 DOA: 02/07/2017 PCP: Patient, No Pcp Per   Brief Narrative:   42 year old female with no significant past medical history does not take any medications at home has been dealing with right-sided hip pain and lower back pain for the past 5-6 weeks.  At first she was seen in the ER and was treated with pain medications but later because her pain did not improve she was seen by orthopedic who ordered an MRI and was noted to have right iliopsoas abscess along with osteomyelitis and discitis of L2-L3.  She was sent to the hospital for further evaluation, IV antibiotics and drainage of her abscess.  Patient underwent drainage placement on 2/7.  IV antibiotics vancomycin and Rocephin were started.  Patient has a PICC line in place in the right arm.  Assessment & Plan:   Principal Problem:   Psoas abscess, right (HCC)   Right hip pain Right iliopsoas abscess status post drain placement 2/7 Osteomyelitis of L3-L4 -Patient is currently status post drain placement 2/7.  It continues to drain with about 200 cc of purulent fluid in her bag -Continue vancomycin and Rocephin day 4 -Infectious disease following -PICC line placed in the right arm on 2/8 -Pain control, IV fluids as necessary -We will repeat blood cultures today  BCx 2/7 = NGTD Wound Cx 2/7- Strep Viridans.   Hypokalemia and hypomagnesemia -Repletion ordered  DVT prophylaxis: SCDs Code Status: Full code Family Communication: None Disposition Plan: Maintain inpatient stay  Consultants:  Interventional radiology Infectious disease  Procedures:   CT-guided drainage of her abscess 2/7  Antimicrobials:   Vancomycin February 08, 2017  Rocephin February 08, 2017   Subjective: She reports of some right hip pain with movement.  Otherwise she was febrile overnight.  Ambulating well, tolerating diet  Objective: Vitals:   02/10/17 1436 02/10/17 2050 02/10/17 2120  02/11/17 0510  BP: 110/61 (!) 116/55  (!) 130/58  Pulse: (!) 112 (!) 116  (!) 114  Resp: 16 20  16   Temp: (!) 100.4 F (38 C) (!) 100.9 F (38.3 C) (!) 101.1 F (38.4 C) 98.9 F (37.2 C)  TempSrc: Axillary Axillary Oral Oral  SpO2: 97% 94%  98%  Weight:      Height:        Intake/Output Summary (Last 24 hours) at 02/11/2017 0959 Last data filed at 02/11/2017 4742 Gross per 24 hour  Intake 2692.5 ml  Output 665 ml  Net 2027.5 ml   Filed Weights   02/07/17 1700  Weight: 73 kg (160 lb 14.4 oz)    Examination:  General exam: Appears calm and comfortable  Respiratory system: Clear to auscultation. Respiratory effort normal. Cardiovascular system: S1 & S2 heard, RRR. No JVD, murmurs, rubs, gallops or clicks. No pedal edema. Gastrointestinal system: Abdomen is nondistended, soft and nontender. No organomegaly or masses felt. Normal bowel sounds heard. Drain noted on the right side with thick whitish purulent fluid coming out-about 200 cc noted. Central nervous system: Alert and oriented. No focal neurological deficits. Extremities: Symmetric 5 x 5 power.  Limited range of motion of her right hip. Skin: No rashes, lesions or ulcers Psychiatry: Judgement and insight appear normal. Mood & affect appropriate.     Data Reviewed:   CBC: Recent Labs  Lab 02/07/17 1739 02/08/17 0616 02/09/17 0607 02/10/17 0852 02/11/17 0658  WBC 17.4* 15.7* 13.8* 15.0* 14.8*  NEUTROABS 14.9* 13.3*  --   --   --  HGB 8.9* 8.5* 8.1* 8.0* 8.1*  HCT 27.8* 26.1* 25.3* 25.4* 25.3*  MCV 85.3 85.6 86.9 87.3 87.2  PLT 719* 637* 638* 599* 587*   Basic Metabolic Panel: Recent Labs  Lab 02/07/17 1739 02/08/17 0616 02/09/17 0607 02/10/17 0852 02/11/17 0658  NA 132* 138 137 135 133*  K 2.9* 3.7 2.8* 3.2* 3.3*  CL 92* 103 106 106 102  CO2 27 26 24 23 22   GLUCOSE 122* 114* 112* 109* 107*  BUN 7 5* <5* <5* <5*  CREATININE 0.65 0.50 0.52 0.48 0.47  CALCIUM 8.2* 8.0* 7.5* 7.6* 7.7*  MG  --  2.1  1.9 1.6* 1.6*   GFR: Estimated Creatinine Clearance: 82.9 mL/min (by C-G formula based on SCr of 0.47 mg/dL). Liver Function Tests: Recent Labs  Lab 02/07/17 1739  AST 27  ALT 27  ALKPHOS 124  BILITOT 0.4  PROT 7.1  ALBUMIN 2.3*   No results for input(s): LIPASE, AMYLASE in the last 168 hours. No results for input(s): AMMONIA in the last 168 hours. Coagulation Profile: Recent Labs  Lab 02/08/17 0841  INR 1.24   Cardiac Enzymes: No results for input(s): CKTOTAL, CKMB, CKMBINDEX, TROPONINI in the last 168 hours. BNP (last 3 results) No results for input(s): PROBNP in the last 8760 hours. HbA1C: No results for input(s): HGBA1C in the last 72 hours. CBG: No results for input(s): GLUCAP in the last 168 hours. Lipid Profile: No results for input(s): CHOL, HDL, LDLCALC, TRIG, CHOLHDL, LDLDIRECT in the last 72 hours. Thyroid Function Tests: No results for input(s): TSH, T4TOTAL, FREET4, T3FREE, THYROIDAB in the last 72 hours. Anemia Panel: No results for input(s): VITAMINB12, FOLATE, FERRITIN, TIBC, IRON, RETICCTPCT in the last 72 hours. Sepsis Labs: Recent Labs  Lab 02/07/17 1947  LATICACIDVEN 1.1    Recent Results (from the past 240 hour(s))  Culture, blood (routine x 2)     Status: None (Preliminary result)   Collection Time: 02/08/17 11:06 AM  Result Value Ref Range Status   Specimen Description   Final    BLOOD RIGHT ARM Performed at [facility], [address] [place], [place] [address]    Special Requests   Final    BOTTLES DRAWN AEROBIC AND ANAEROBIC Blood Culture adequate volume Performed at [facility], [address] [place], [place] [address]    Culture   Final    NO GROWTH 3 DAYS Performed at [facility], [address] [place], [place] [address]    Report Status PENDING  Incomplete  Culture, blood (routine x 2)     Status: None (Preliminary result)   Collection Time: 02/08/17 11:06 AM  Result Value  Ref Range Status   Specimen Description   Final    BLOOD LEFT HAND Performed at [facility], [address] [place], [place] [address]    Special Requests   Final    IN PEDIATRIC BOTTLE Blood Culture adequate volume Performed at [facility], [address] [place], [place] [address]    Culture   Final    NO GROWTH 3 DAYS Performed at [facility], [address] [place], [place] [address]    Report Status PENDING  Incomplete  Aerobic/Anaerobic Culture (surgical/deep wound)     Status: None (Preliminary result)   Collection Time: 02/08/17  5:07 PM  Result Value Ref Range Status   Specimen Description   Final    ABSCESS BACK RIGHT LOWER Performed at [facility], [address], [place], [place] [address]    Special Requests   Final    NONE Performed at [facility], [address], [place], [place] [address]    Gram Stain   Final    ABUNDANT WBC PRESENT, PREDOMINANTLY PMN NO ORGANISMS SEEN Performed at [facility], [address], [place], [place] [address]    Culture ABUNDANT VIRIDANS STREPTOCOCCUS  Final   Report Status PENDING  Incomplete         Radiology Studies: No results found.      Scheduled Meds: . sodium chloride flush  5 mL Intravenous Q8H   Continuous Infusions: . cefTRIAXone (ROCEPHIN)  IV Stopped (02/10/17 2245)     LOS: 4 days    Time spent: 25 mins     Ankit Joline Maxcy, MD [facility] Pager [phone]   If 7PM-7AM, please contact night-coverage [email or web address] Password [email or web address] 02/11/2017, 9:59 AM

## 2017-02-12 ENCOUNTER — Other Ambulatory Visit: Payer: Self-pay | Admitting: Internal Medicine

## 2017-02-12 DIAGNOSIS — B954 Other streptococcus as the cause of diseases classified elsewhere: Secondary | ICD-10-CM

## 2017-02-12 DIAGNOSIS — K6812 Psoas muscle abscess: Secondary | ICD-10-CM

## 2017-02-12 MED ORDER — HEPARIN SOD (PORK) LOCK FLUSH 100 UNIT/ML IV SOLN
250.0000 [IU] | INTRAVENOUS | Status: AC | PRN
  Administered 2017-02-12: 250 [IU]

## 2017-02-12 MED ORDER — SODIUM CHLORIDE 0.9 % IV SOLN: 2.0000 g | INTRAVENOUS | Status: DC

## 2017-02-12 MED ORDER — OXYCODONE-ACETAMINOPHEN 7.5-325 MG PO TABS: 1.0000 | ORAL_TABLET | ORAL | 0 refills | Status: DC | PRN

## 2017-02-12 MED ORDER — SODIUM CHLORIDE 0.9 % IV SOLN
2.0000 g | INTRAVENOUS | Status: DC
  Filled 2017-02-12: qty 20

## 2017-02-12 MED ORDER — CEFTRIAXONE IV (FOR PTA / DISCHARGE USE ONLY): 2.0000 g | INTRAVENOUS | 0 refills | Status: DC

## 2017-02-12 MED ORDER — SENNA 8.6 MG PO TABS: 1.0000 | ORAL_TABLET | Freq: Every day | ORAL | 0 refills | Status: DC | PRN

## 2017-02-12 NOTE — Progress Notes (Signed)
Discharge instructions given to patient and husband. Drain care of changing dressing and flushing drain instruction with return demonstration done with husband. Sharrell Ku Copeland Lapier RN

## 2017-02-12 NOTE — Progress Notes (Signed)
Patient ID: Joy Morris, female   DOB: March 14, 1975, 42 y.o.   MRN: [id number]    Referring Physician(s): Dr. Stephania Fragmin  Supervising Physician: Richarda Overlie  Patient Status: [facility] - In-pt  Chief Complaint: Psoas abscess  Subjective: Patient still with some abdominal discomfort.  Says her back pain is slowly improving.  Draining a lot of purulent drainage.  Allergies: Patient has no known allergies.  Medications: Prior to Admission medications   Medication Sig Start Date End Date Taking? Authorizing Provider  acetaminophen-codeine (TYLENOL #3) 300-30 MG tablet Take 1 tablet by mouth every 6 hours prn for pain   Yes [provider]  fluticasone (FLONASE) 50 MCG/ACT nasal spray Place 1 spray into both nostrils daily.   Yes [provider]  methocarbamol (ROBAXIN) 500 MG tablet TK 2 TS PO Q 6 TO 8 H PRN FOR INFLAMMATION AND PAIN 01/08/17  Yes [provider]  meloxicam (MOBIC) 15 MG tablet meloxicam 15 mg tablet    [provider]    Vital Signs: BP 118/64 (BP Location: Left Arm)   Pulse (!) 108   Temp 99 F (37.2 C) (Oral)   Resp 16   Ht 5\' 1"  (1.549 m)   Wt 160 lb 14.4 oz (73 kg)   LMP 08/16/2015 (Approximate)   SpO2 93%   BMI 30.40 kg/m   Physical Exam: Abd: R flank drain with over 200cc of purulent output since 0600am this morning.  Drain site is c/d/i.    Imaging: Ct Image Guided Drainage By Percutaneous Catheter  Result Date: 02/08/2017 CLINICAL DATA:  Right psoas abscess EXAM: CT GUIDED DRAINAGE OF PSOAS ABSCESS ANESTHESIA/SEDATION: Intravenous Fentanyl and Versed were administered as conscious sedation during continuous monitoring of the patient's level of consciousness and physiological / cardiorespiratory status by the radiology RN, with a total moderate sedation time of 14 minutes. PROCEDURE: The procedure, risks, benefits, and alternatives were explained to the patient. Questions regarding the procedure were encouraged and answered.  The patient understands and consents to the procedure. Patient placed prone. Select axial scans through the mid abdomen obtained. The collection was localized and an appropriate skin entry site was determined and marked. The operative field was prepped with chlorhexidinein a sterile fashion, and a sterile drape was applied covering the operative field. A sterile gown and sterile gloves were used for the procedure. Local anesthesia was provided with 1% Lidocaine. Under CT fluoroscopic guidance, a 19 gauge percutaneous entry needle was advanced into the collection. Green purulent thick material could be aspirated. An Amplatz guidewire advanced easily within the collection, its position confirmed on CT to facilitate placement of a 12 French pigtail catheter, formed centrally within the collection. CT confirms appropriate positioning. The catheter was secured externally with 0 Prolene suture and StatLock and placed to gravity drain bag. 20 mL purulent aspirate sent for Gram stain and culture. The patient tolerated the procedure well. COMPLICATIONS: None immediate FINDINGS: Loculated large right psoas collection was again localized. 12 French drain catheter placed as above. Sample of the aspirate sent for Gram stain and culture. IMPRESSION: 1. Technically successful CT-guided right psoas retroperitoneal abscess drain catheter placement. Electronically Signed   By: Corlis Leak M.D.   On: 02/08/2017 18:13    Labs:  CBC: Recent Labs    02/08/17 0616 02/09/17 0607 02/10/17 0852 02/11/17 0658  WBC 15.7* 13.8* 15.0* 14.8*  HGB 8.5* 8.1* 8.0* 8.1*  HCT 26.1* 25.3* 25.4* 25.3*  PLT 637* 638* 599* 587*    COAGS: Recent Labs  02/08/17 0841  INR 1.24    BMP: Recent Labs    02/08/17 0616 02/09/17 0607 02/10/17 0852 02/11/17 0658  NA 138 137 135 133*  K 3.7 2.8* 3.2* 3.3*  CL 103 106 106 102  CO2 26 24 23 22   GLUCOSE 114* 112* 109* 107*  BUN 5* <5* <5* <5*  CALCIUM 8.0* 7.5* 7.6* 7.7*    CREATININE 0.50 0.52 0.48 0.47  GFRNONAA >60 >60 >60 >60  GFRAA >60 >60 >60 >60    LIVER FUNCTION TESTS: Recent Labs    02/07/17 1739  BILITOT 0.4  AST 27  ALT 27  ALKPHOS 124  PROT 7.1  ALBUMIN 2.3*    Assessment and Plan: 1. R Psoas abscess, s/p perc drain  Patient with copious purulent drainage.  Cont drain for now.  Cont flushing.  [facility].  Flush drain with 5cc of NS daily.  Instructions of drain care were discussed with the patient.  She will return to drain clinic next week for follow up scan and drain evaluation.  Electronically Signed: Letha CapeKelly E Machelle Raybon 02/12/2017, 11:42 AM   I spent a total of 15 Minutes at the the patient's bedside AND on the patient's hospital floor or unit, greater than 50% of which was counseling/coordinating care for R psoas abscess

## 2017-02-12 NOTE — Discharge Summary (Signed)
Physician Discharge Summary  Jaselle Pryer DQQ:[id number] DOB: 05/10/1975 DOA: 02/07/2017  PCP: Patient, No Pcp Per  Admit date: 02/07/2017 Discharge date: 02/12/2017  Admitted From: Home Disposition: Home  Recommendations for Outpatient Follow-up:  1. Follow up with PCP in 1-2 weeks 2. P follow-up with Dr. Megan Salon from infectious disease in about 3-4 weeks.  Arrangements will be made by his office 3. Follow-up with interventional radiology, Dr Vernard Gambles, in about a week.  Arrangements will be made by their office 4. Infuse Rocephin 2 g every 24 hours for about 6 weeks.  Further instructions will be given during your follow-up appointment. 5. Arrangement for home RN has been made.  Home Health: RN Equipment/Devices: PICC line Discharge Condition: Stable CODE STATUS: full Code Diet recommendation:Regular  Brief/Interim Summary: 42 year old female without any significant past medical history not on any home medication came to the hospital with complains of persistent right-sided hip pain.  Patient had been dealing with right-sided hip pain for the past month or so which initially was treated with pain medications and eventually had a follow-up with orthopedic appointment.  Had an MRI outpatient done which showed right iliopsoas abscess with concerns of osteomyelitis of L2-L3.  She was sent to the hospital for further evaluation.  In the ER she was started on IV Rocephin and vancomycin and admitted for further care.  During her hospital stay she was followed by infectious disease.  Plans were made for patient to get IR drain placement which was successfully done.  It continued to drain purulent substance while she was in the hospital.  Her cultures eventually grew strep viridans which were susceptible to Rocephin therefore her vancomycin was discontinued. She was ambulating well in the hospital, tolerating oral diet.  Today she has reached maximum benefit from an hospital stay and is stable to be  discharged with outpatient follow-up recommendations as stated above. She needs to follow-up with infectious disease in about 3-4 weeks.  Follow-up with interventional radiology in about a week.  Case discussed with Dr. Megan Salon and interventional radiology PA who will make arrangements for further follow-up.  With the help of OPAT outpatient IV antibiotic arrangements were made for Rocephin 2 g IV every 24 hours. Patient currently has a PICC line in place in the right upper extremity without any issues.  No complaints today.  Patient is eager to go home.  I have advised her to return to work in about couple of days after discharge.  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  Cultures are growing strep viridans which is susceptible to Rocephin.  Vancomycin discontinued.  We will continue Rocephin for total of 5-6 weeks. -Follow-up recommendations stated above. -We will give Percocet 7.5 mg/325 to be taken orally for severe pain, 20 tabs dispensed.  I have also prescribed her with senna to be taken if necessary for constipation.    Discharge Instructions  Discharge Instructions    Home infusion instructions [facility] May follow [place] Dosing Protocol; May administer Cathflo as needed to maintain patency of vascular access device.; Flushing of vascular access device: per [facility] Protocol: 0.9% NaCl pre/post medica...   Complete by:  As directed    Instructions:  May follow [place] Dosing Protocol   Instructions:  May administer Cathflo as needed to maintain patency of vascular access device.   Instructions:  Flushing of vascular access device: per [facility] Protocol: 0.9% NaCl pre/post medication administration and prn patency; Heparin 100 u/ml, 89m for implanted ports and Heparin 10u/ml, 522mfor all other central venous  catheters.   Instructions:  May follow [facility] Anaphylaxis Protocol for First Dose Administration in the home: 0.9% NaCl at 25-50 ml/hr to maintain IV access for protocol meds. Epinephrine 0.3 ml IV/IM PRN and Benadryl 25-50 IV/IM PRN s/s of anaphylaxis.   Instructions:  [facility] Coordinator (RN) to assist per patient IV care needs in the home PRN.     Allergies as of 02/12/2017   No Known Allergies     Medication List    TAKE these medications   acetaminophen-codeine 300-30 MG tablet Commonly known as:  TYLENOL #3 Take 1 tablet by mouth every 6 hours prn for pain   cefTRIAXone 2 g in sodium chloride 0.9 % 100 mL Inject 2 g into the vein daily.   cefTRIAXone IVPB Commonly known as:  ROCEPHIN Inject 2 g into the vein daily. 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   meloxicam 15 MG tablet Commonly known as:  MOBIC meloxicam 15 mg tablet   methocarbamol 500 MG tablet Commonly known as:  ROBAXIN TK 2 TS PO Q 6 TO 8 H PRN FOR INFLAMMATION AND PAIN   oxyCODONE-acetaminophen 7.5-325 MG tablet Commonly known as:  PERCOCET Take 1 tablet by mouth every 4 (four) hours as needed for severe pain.   senna 8.6 MG Tabs tablet Commonly known as:  SENOKOT Take 1 tablet (8.6 mg total) by mouth daily as needed for mild constipation.            Home Infusion Instuctions  (From admission, onward)        Start     Ordered   02/12/17 0000  Home infusion instructions [facility] May follow [facility] Protocol; May administer Cathflo as needed to maintain patency of vascular access device.; Fl[facility]: 0.9% NaCl pre/post medica...    Question Answer Comment  Instructions [place] [facility] [place]   Instructions May  administer Cathflo as needed to maintain patency of vascular access device.   Instructions Flushing of vascular access device: [facility]: 0.9% NaCl pre/post medication administration and prn patency; Heparin 100 u/ml, 23m78mor implanted ports and Heparin 10u/ml, 23ml69mr all other central venous catheters.   Instructions May follow [facility] Anaphylaxis Protocol for First Dose Administration in the home: 0.9% NaCl at 25-50 ml/hr to maintain IV access for protocol meds. Epinephrine 0.3 ml IV/IM PRN and Benadryl 25-50 IV/IM PRN s/s of anaphylaxis.   Instructions [facility] Infusion Coordinator (RN) to assist per patient IV care needs in the home PRN.      02/12/17 1244     Follow-up Information    [facility] up in 1 week(s).   Specialties:  Interventional Radiology, Radiology Why:  our office will call you to arrange for follow up appointment 1 week Contact information: [address] [phone]      No Known Allergies  On your next visit with your primary care physician please Get Medicines reviewed and adjusted.   Please request your Prim.MD  to go over all Hospital Tests and Procedure/Radiological results at the follow up, please get all Hospital records sent to your Prim MD by signing hospital release before you go home.   If you experience worsening of your admission symptoms, develop shortness of breath, life threatening emergency, suicidal or homicidal thoughts you must seek medical attention immediately by calling 911 or calling your MD immediately  if symptoms less severe.  You Must read complete instructions/literature along with all the possible adverse reactions/side effects for all the Medicines you take and that have been prescribed to you. Take any new Medicines after you have completely understood and accpet all the possible adverse reactions/side effects.   Do not drive, operate heavy machinery,  perform activities at heights, swimming or participation in water activities or provide baby sitting services if your were admitted for syncope or siezures until you have seen by Primary MD or a Neurologist and advised to do so again.  Do not drive when taking Pain medications.    Do not take more than prescribed Pain, Sleep and Anxiety Medications  Special Instructions: If you have smoked or chewed Tobacco  in the last 2 yrs please stop smoking, stop any regular Alcohol  and or any Recreational drug use.  Wear Seat belts while driving.   Please note  You were cared for by a hospitalist during your hospital stay. If you have any questions about your discharge medications or the care you received while you were in the hospital after you are discharged, you can call the unit and asked to speak with the hospitalist on call if the hospitalist that took care of you is not available. Once you are discharged, your primary care physician will handle any further medical issues. Please note that NO REFILLS for any discharge medications will be authorized once you are discharged, as it is imperative that you return to your primary care physician (or establish a relationship with a primary care physician if you do not have one) for your aftercare needs so that they can reassess your need for medications and monitor your lab values.   Increase activity slowly        Consultations:  Infectious disease  Interventional radiology   Procedures/Studies: Ct Image Guided Drainage By Percutaneous Catheter  Result Date: 02/08/2017 CLINICAL DATA:  Right psoas abscess EXAM: CT GUIDED DRAINAGE OF PSOAS ABSCESS ANESTHESIA/SEDATION: Intravenous Fentanyl and Versed were administered as conscious sedation during continuous monitoring of the patient's level of consciousness and physiological / cardiorespiratory status by the radiology RN, with a total moderate sedation time of 14 minutes. PROCEDURE: The  procedure, risks, benefits, and alternatives were explained to the patient. Questions regarding the procedure were encouraged and answered. The patient understands and consents to the procedure. Patient placed prone. Select axial scans through the mid abdomen obtained. The collection was localized and an appropriate skin entry site was determined and marked. The operative field was prepped with chlorhexidinein a sterile fashion, and a sterile drape was applied covering the operative field. A sterile gown and sterile gloves were used for the procedure. Local anesthesia was provided with 1% Lidocaine. Under CT fluoroscopic guidance, a 19 gauge percutaneous entry needle was advanced into the collection. Green purulent thick material could be aspirated. An Amplatz guidewire advanced easily within the collection, its position confirmed on CT to facilitate placement of a 12 French pigtail catheter, formed centrally within the collection. CT confirms appropriate positioning. The catheter was secured externally with 0 Prolene suture and StatLock  and placed to gravity drain bag. 20 mL purulent aspirate sent for Gram stain and culture. The patient tolerated the procedure well. COMPLICATIONS: None immediate FINDINGS: Loculated large right psoas collection was again localized. 12 French drain catheter placed as above. Sample of the aspirate sent for Gram stain and culture. IMPRESSION: 1. Technically successful CT-guided right psoas retroperitoneal abscess drain catheter placement. Electronically Signed   By: Lucrezia Europe M.D.   On: 02/08/2017 18:13       Subjective: No complaints this morning.  Good output from her drain-about 100 cc noted in her back this morning.  Discharge Exam: Vitals:   02/11/17 2145 02/12/17 0600  BP: 119/70 118/64  Pulse: (!) 119 (!) 108  Resp: 16 16  Temp: 98.6 F (37 C) 99 F (37.2 C)  SpO2: 94% 93%   Vitals:   02/11/17 0510 02/11/17 1259 02/11/17 2145 02/12/17 0600  BP: (!) 130/58  114/68 119/70 118/64  Pulse: (!) 114 (!) 120 (!) 119 (!) 108  Resp: 16 16 16 16   Temp: 98.9 F (37.2 C) (!) 97 F (36.1 C) 98.6 F (37 C) 99 F (37.2 C)  TempSrc: Oral Axillary Oral Oral  SpO2: 98% 97% 94% 93%  Weight:      Height:        General: Pt is alert, awake, not in acute distress Cardiovascular: RRR, S1/S2 +, no rubs, no gallops Respiratory: CTA bilaterally, no wheezing, no rhonchi Abdominal: Soft, NT, ND, bowel sounds +; right-sided drain noted.  No signs of bleeding or infection in place.  Has about 100 cc of purulent fluid. Extremities: no edema, no cyanosis    The results of significant diagnostics from this hospitalization (including imaging, microbiology, ancillary and laboratory) are listed below for reference.     Microbiology: Recent Results (from the past 240 hour(s))  Culture, blood (routine x 2)     Status: None (Preliminary result)   Collection Time: 02/08/17 11:06 AM  Result Value Ref Range Status   Specimen Description   Final    BLOOD RIGHT ARM Performed at [facility] [address] [place], [place] [address]    Special Requests   Final    BOTTLES DRAWN AEROBIC AND ANAEROBIC Blood Culture adequate volume Performed at [facility] [address] [place], [place] [address]    Culture   Final    NO GROWTH 4 DAYS Performed at [facility], [place] [address], [place], [place] [address]    Report Status PENDING  Incomplete  Culture, blood (routine x 2)     Status: None (Preliminary result)   Collection Time: 02/08/17 11:06 AM  Result Value Ref Range Status   Specimen Description   Final    BLOOD LEFT HAND Performed at [facility] [address], [place], [place] [address]    Special Requests   Final    IN PEDIATRIC BOTTLE Blood Culture adequate volume Performed at [facility] [address], [place], [place] [address]    Culture   Final    NO GROWTH 4  DAYS Performed at [facility], [place] [address], [place], [place] [address]    Report Status PENDING  Incomplete  Aerobic/Anaerobic Culture (surgical/deep wound)     Status: None (Preliminary result)   Collection Time: 02/08/17  5:07 PM  Result Value Ref Range Status   Specimen Description   Final    ABSCESS BACK RIGHT LOWER Performed at [facility], [place] [address], [place], [place] [address]    Special Requests   Final    NONE Performed at [facility], [place] [address], [place], [place] [address]    Gram Stain   Final    ABUNDANT WBC PRESENT, PREDOMINANTLY PMN NO ORGANISMS SEEN Performed at [facility], [place] [address], [place], [place] [address]    Culture   Final    ABUNDANT STREPTOCOCCUS INTERMEDIUS NO ANAEROBES ISOLATED; CULTURE IN PROGRESS FOR 5 DAYS    Report Status PENDING  Incomplete   Organism ID, Bacteria STREPTOCOCCUS INTERMEDIUS  Final      Susceptibility   Streptococcus intermedius - MIC*    PENICILLIN <=0.06 SENSITIVE Sensitive     CEFTRIAXONE <=0.12 SENSITIVE Sensitive     ERYTHROMYCIN 2 RESISTANT Resistant     LEVOFLOXACIN 0.5 SENSITIVE Sensitive     VANCOMYCIN 0.25 SENSITIVE Sensitive     * ABUNDANT STREPTOCOCCUS INTERMEDIUS     Labs: BNP (last 3 results) No results for input(s): BNP in the last 8760 hours. Basic Metabolic Panel: Recent Labs  Lab 02/07/17 1739 02/08/17 0616 02/09/17 0607 02/10/17 0852 02/11/17 0658  NA 132* 138 137 135 133*  K 2.9* 3.7 2.8* 3.2* 3.3*  CL 92* 103 106 106 102  CO2 27 26 24 23 22   GLUCOSE 122* 114* 112* 109* 107*  BUN 7 5* <5* <5* <5*  CREATININE 0.65 0.50 0.52 0.48 0.47  CALCIUM 8.2* 8.0* 7.5* 7.6* 7.7*  MG  --  2.1 1.9 1.6* 1.6*   Liver Function Tests: Recent Labs  Lab 02/07/17 1739  AST 27  ALT 27  ALKPHOS 124  BILITOT 0.4  PROT 7.1  ALBUMIN 2.3*   No results for input(s): LIPASE, AMYLASE in the last 168 hours. No results for input(s): AMMONIA in  the last 168 hours. CBC: Recent Labs  Lab 02/07/17 1739 02/08/17 0616 02/09/17 0607 02/10/17 0852 02/11/17 0658  WBC 17.4* 15.7* 13.8* 15.0* 14.8*  NEUTROABS 14.9* 13.3*  --   --   --   HGB 8.9* 8.5* 8.1* 8.0* 8.1*  HCT 27.8* 26.1* 25.3* 25.4* 25.3*  MCV 85.3 85.6 86.9 87.3 87.2  PLT 719* 637* 638* 599* 587*   Cardiac Enzymes: No results for input(s): CKTOTAL, CKMB, CKMBINDEX, TROPONINI in the last 168 hours. BNP: Invalid input(s): POCBNP CBG: No results for input(s): GLUCAP in the last 168 hours. D-Dimer No results for input(s): DDIMER in the last 72 hours. Hgb A1c No results for input(s): HGBA1C in the last 72 hours. Lipid Profile No results for input(s): CHOL, HDL, LDLCALC, TRIG, CHOLHDL, LDLDIRECT in the last 72 hours. Thyroid function studies No results for input(s): TSH, T4TOTAL, T3FREE, THYROIDAB in the last 72 hours.  Invalid input(s): FREET3 Anemia work up No results for input(s): VITAMINB12, FOLATE, FERRITIN, TIBC, IRON, RETICCTPCT in the last 72 hours. Urinalysis No results found for: COLORURINE, APPEARANCEUR, [place], [place], [place], [place], [place], [place], PROTEINUR, UROBILINOGEN, NITRITE, LEUKOCYTESUR Sepsis Labs Invalid input(s): PROCALCITONIN,  WBC,  LACTICIDVEN Microbiology Recent Results (from the past 240 hour(s))  Culture, blood (routine x 2)     Status: None (Preliminary result)   Collection Time: 02/08/17 11:06 AM  Result Value Ref Range Status   Specimen Description   Final    BLOOD RIGHT ARM Performed at [facility] [address] [place], [place] [address]    Special Requests   Final    BOTTLES DRAWN AEROBIC AND ANAEROBIC Blood Culture adequate volume Performed at [facility]  [place] [address] [place], [place] [address]    Culture   Final    NO GROWTH 4 DAYS Performed at [facility], [place] [address] [place], [place] [address]    Report Status PENDING  Incomplete  Culture, blood  (routine x 2)     Status: None (Preliminary result)   Collection Time: 02/08/17 11:06 AM  Result Value Ref Range Status   Specimen Description   Final    BLOOD LEFT HAND Performed at [facility] [address] [place], [place] [address]    Special Requests   Final    IN PEDIATRIC BOTTLE Blood Culture adequate volume Performed at [facility] [address] [place], [place] [address]    Culture   Final    NO GROWTH 4 DAYS Performed at [facility], [place] [address] [place] [address] [place], [place] [address]    Report Status PENDING  Incomplete  Aerobic/Anaerobic Culture (surgical/deep wound)     Status: None (Preliminary result)   Collection Time: 02/08/17  5:07 PM  Result Value Ref Range Status   Specimen Description   Final    ABSCESS BACK RIGHT LOWER Performed at [facility] [address] [place], [place] [address]    Special Requests   Final    NONE Performed at [facility], [place] [address], [place], [place] [address]    Gram Stain   Final    ABUNDANT WBC PRESENT, PREDOMINANTLY PMN NO ORGANISMS SEEN Performed at [facility], [place] [address], [place], [place] [address]    Culture   Final    ABUNDANT STREPTOCOCCUS INTERMEDIUS NO ANAEROBES ISOLATED; CULTURE IN PROGRESS FOR 5 DAYS    Report Status PENDING  Incomplete   Organism ID, Bacteria STREPTOCOCCUS INTERMEDIUS  Final      Susceptibility   Streptococcus intermedius - MIC*    PENICILLIN <=0.06 SENSITIVE Sensitive     CEFTRIAXONE <=0.12 SENSITIVE Sensitive     ERYTHROMYCIN 2 RESISTANT Resistant     LEVOFLOXACIN 0.5 SENSITIVE Sensitive     VANCOMYCIN 0.25 SENSITIVE Sensitive     * ABUNDANT STREPTOCOCCUS INTERMEDIUS     Time coordinating discharge: Over 30 minutes  SIGNED:   Damita Lack, MD  [facility] 02/12/2017, 12:44 PM Pager   If 7PM-7AM, please contact night-coverage [email or web address] Password [email or web address]

## 2017-02-12 NOTE — Progress Notes (Signed)
PHARMACY CONSULT NOTE FOR:  OUTPATIENT  PARENTERAL ANTIBIOTIC THERAPY (OPAT)  Indication: L2-3 discitis and right psoas abscess  Regimen: Ceftriaxone 2g IV q24h  End date: 03/22/2017 per ID  IV antibiotic discharge orders are pended. To discharging provider:  please sign these orders via discharge navigator,  Select New Orders & click on the button choice - Manage This Unsigned Work.     Thank you for allowing pharmacy to be a part of this patient's care.  Jamse MeadGadhia, Brette Cast M 02/12/2017, 11:47 AM

## 2017-02-12 NOTE — Discharge Instructions (Signed)
Flush drain with 5cc of normal saline daily.  Record output daily.  Percutaneous Abscess Drain, Care After This sheet gives you information about how to care for yourself after your procedure. Your health care provider may also give you more specific instructions. If you have problems or questions, contact your health care provider. What can I expect after the procedure? After your procedure, it is common to have:  A small amount of bruising and discomfort in the area where the drainage tube (catheter) was placed.  Sleepiness and fatigue. This should go away after the medicines you were given have worn off.  Follow these instructions at home: Incision care  Follow instructions from your health care provider about how to take care of your incision. Make sure you: ? Wash your hands with soap and water before you change your bandage (dressing). If soap and water are not available, use hand sanitizer. ? Change your dressing as told by your health care provider. ? Leave stitches (sutures), skin glue, or adhesive strips in place. These skin closures may need to stay in place for 2 weeks or longer. If adhesive strip edges start to loosen and curl up, you may trim the loose edges. Do not remove adhesive strips completely unless your health care provider tells you to do that.  Check your incision area every day for signs of infection. Check for: ? More redness, swelling, or pain. ? More fluid or blood. ? Warmth. ? Pus or a bad smell. ? Fluid leaking from around your catheter (instead of fluid draining through your catheter). Catheter care  Follow instructions from your health care provider about emptying and cleaning your catheter and collection bag. You may need to clean the catheter every day so it does not clog.  If directed, write down the following information every time you empty your bag: ? The date and time. ? The amount of drainage. General instructions  Rest at home for 1-2 days  after your procedure. Return to your normal activities as told by your health care provider.  Do not take baths, swim, or use a hot tub for 24 hours after your procedure, or until your health care provider says that this is okay.  Take over-the-counter and prescription medicines only as told by your health care provider.  Keep all follow-up visits as told by your health care provider. This is important. Contact a health care provider if:  You have less than 10 mL of drainage a day for 2-3 days in a row, or as directed by your health care provider.  You have more redness, swelling, or pain around your incision area.  You have more fluid or blood coming from your incision area.  Your incision area feels warm to the touch.  You have pus or a bad smell coming from your incision area.  You have fluid leaking from around your catheter (instead of through your catheter).  You have a fever or chills.  You have pain that does not get better with medicine. Get help right away if:  Your catheter comes out.  You suddenly stop having drainage from your catheter.  You suddenly have blood in the fluid that is draining from your catheter.  You become dizzy or you faint.  You develop a rash.  You have nausea or vomiting.  You have difficulty breathing or you feel short of breath.  You develop chest pain.  You have problems with your speech or vision.  You have trouble balancing or moving  your arms or legs. Summary  It is common to have a small amount of bruising and discomfort in the area where the drainage tube (catheter) was placed.  You may be directed to record the amount of drainage from the bag every time you empty it.  Follow instructions from your health care provider about emptying and cleaning your catheter and collection bag. This information is not intended to replace advice given to you by your health care provider. Make sure you discuss any questions you have with  your health care provider. Document Released: 05/05/2013 Document Revised: 11/11/2015 Document Reviewed: 11/11/2015 Elsevier Interactive Patient Education  2017 Elsevier Inc. Moderate Conscious Sedation, Adult, Care After These instructions provide you with information about caring for yourself after your procedure. Your health care provider may also give you more specific instructions. Your treatment has been planned according to current medical practices, but problems sometimes occur. Call your health care provider if you have any problems or questions after your procedure. What can I expect after the procedure? After your procedure, it is common:  To feel sleepy for several hours.  To feel clumsy and have poor balance for several hours.  To have poor judgment for several hours.  To vomit if you eat too soon.  Follow these instructions at home: For at least 24 hours after the procedure:   Do not: ? Participate in activities where you could fall or become injured. ? Drive. ? Use heavy machinery. ? Drink alcohol. ? Take sleeping pills or medicines that cause drowsiness. ? Make important decisions or sign legal documents. ? Take care of children on your own.  Rest. Eating and drinking  Follow the diet recommended by your health care provider.  If you vomit: ? Drink water, juice, or soup when you can drink without vomiting. ? Make sure you have little or no nausea before eating solid foods. General instructions  Have a responsible adult stay with you until you are awake and alert.  Take over-the-counter and prescription medicines only as told by your health care provider.  If you smoke, do not smoke without supervision.  Keep all follow-up visits as told by your health care provider. This is important. Contact a health care provider if:  You keep feeling nauseous or you keep vomiting.  You feel light-headed.  You develop a rash.  You have a fever. Get help right  away if:  You have trouble breathing. This information is not intended to replace advice given to you by your health care provider. Make sure you discuss any questions you have with your health care provider. Document Released: 10/09/2012 Document Revised: 05/24/2015 Document Reviewed: 04/10/2015 Elsevier Interactive Patient Education  [employer]2018 Elsevier Inc.

## 2017-02-12 NOTE — Progress Notes (Signed)
Patient ID: Joy Morris, female   DOB: May 31, 1975, 42 y.o.   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  She is improving on empiric antibiotics.  Admission blood cultures are negative.  Abscess cultures grew Streptococcus intermedius sensitive to ceftriaxone.  PLAN: 1. Continue ceftriaxone pending final antibiotic susceptibility results 2. Discharge home today  Diagnosis: L2-3 discitis and right psoas abscess  Culture Result: Viridans strep  No Known Allergies  OPAT Orders Discharge antibiotics: Per pharmacy protocol ceftriaxone  Duration: 6 weeks End Date: 03/22/2017  [facility] Care Per Protocol:  Labs weekly while on IV antibiotics: _x_ CBC with differential _x_ BMP __ CMP _x_ CRP _x_ ESR __ Vancomycin trough  __ Please pull PIC at completion of IV antibiotics _x_ Please leave PIC in place until doctor has seen patient or been notified  Fax weekly labs to [phone]  Clinic Follow Up Appt: I will arrange follow-up in my clinic in 3-4 weeks  Principal Problem:   Psoas abscess, right ([place])   Scheduled Meds: . sodium chloride flush  5 mL Intravenous Q8H   Continuous Infusions: . cefTRIAXone (ROCEPHIN)  IV     PRN Meds:.acetaminophen, feeding supplement (ENSURE ENLIVE), fentaNYL, lidocaine, midazolam, morphine injection, oxyCODONE, sodium chloride flush   SUBJECTIVE: No fever, chills or sweats last night.  Review of Systems: Review of Systems  Constitutional: Positive for weight loss. Negative for chills, diaphoresis and fever.  Gastrointestinal: Positive for abdominal pain. Negative for diarrhea, nausea and vomiting.       She continues to have purulent, green drainage from her abscess drained.  She had 140 cc out yesterday and 125 cc so far today.  Musculoskeletal: Positive for back pain.    Neurological: Positive for sensory change and focal weakness. Negative for headaches.    No Known Allergies  OBJECTIVE: Vitals:   02/11/17 0510 02/11/17 1259 02/11/17 2145 02/12/17 0600  BP: (!) 130/58 114/68 119/70 118/64  Pulse: (!) 114 (!) 120 (!) 119 (!) 108  Resp: _0 Temp: 98.9 F (37.2 C) (!) 97 F (36.1 C) 98.6 F (37 C) 99 F (37.2 C)  TempSrc: Oral Axillary Oral Oral  SpO2: 98% 97% 94% 93%  Weight:      Height:       Body mass index is 30.4 kg/m.  Physical Exam  Constitutional: She is oriented to person, place, and time.  She is in good spirits.  Cardiovascular: Normal rate and regular rhythm.  No murmur heard. Pulmonary/Chest: Effort normal. She has no wheezes. She has no rales.  Abdominal: Soft. She exhibits no distension. There is no tenderness.  Thick green fluid in drain bag.  Recorded drain output 470 cc yesterday.  200 cc in drain bag now.  Neurological: She is alert and oriented to person, place, and time.  Skin: No rash noted.  Psychiatric: Mood and affect normal.    Lab Results Lab Results  Component Value Date   WBC 14.8 (H) 02/11/2017   HGB 8.1 (L) 02/11/2017   HCT 25.3 (L) 02/11/2017   MCV 87.2 02/11/2017   PLT 587 (H) 02/11/2017    Lab Results  Component Value Date   CREATININE 0.47 02/11/2017   BUN <5 (L) 02/11/2017   NA 133 (L) 02/11/2017   K 3.3 (  L) 02/11/2017   CL 102 02/11/2017   CO2 22 02/11/2017    Lab Results  Component Value Date   ALT 27 02/07/2017   AST 27 02/07/2017   ALKPHOS 124 02/07/2017   BILITOT 0.4 02/07/2017     Microbiology: Recent Results (from the past 240 hour(s))  Culture, blood (routine x 2)     Status: None (Preliminary result)   Collection Time: 02/08/17 11:06 AM  Result Value Ref Range Status   Specimen Description   Final    BLOOD RIGHT ARM Performed at [facility] [address] [place], [place] [address]    Special Requests   Final    BOTTLES DRAWN AEROBIC  AND ANAEROBIC Blood Culture adequate volume Performed at [facility] [address] [place], [place] [address]    Culture   Final    NO GROWTH 4 DAYS Performed at [facility], [place] [address] [place], [place] [address]    Report Status PENDING  Incomplete  Culture, blood (routine x 2)     Status: None (Preliminary result)   Collection Time: 02/08/17 11:06 AM  Result Value Ref Range Status   Specimen Description   Final    BLOOD LEFT HAND Performed at [facility] [address], [place], [place] [address]    Special Requests   Final    IN PEDIATRIC BOTTLE Blood Culture adequate volume Performed at [facility] [address] [place], [place] [address]    Culture   Final    NO GROWTH 4 DAYS Performed at [facility], [place] [address] [place], [place] [address]    Report Status PENDING  Incomplete  Aerobic/Anaerobic Culture (surgical/deep wound)     Status: None (Preliminary result)   Collection Time: 02/08/17  5:07 PM  Result Value Ref Range Status   Specimen Description   Final    ABSCESS BACK RIGHT LOWER Performed at [facility] [address], [place], [place] [address]    Special Requests   Final    NONE Performed at [facility], [place] [address], [place], [place] [address]    Gram Stain   Final    ABUNDANT WBC PRESENT, PREDOMINANTLY PMN NO ORGANISMS SEEN Performed at [facility], [place] [address] [place], [place] [address]    Culture   Final    ABUNDANT STREPTOCOCCUS INTERMEDIUS NO ANAEROBES ISOLATED; CULTURE IN PROGRESS FOR 5 DAYS    Report Status PENDING  Incomplete   Organism ID, Bacteria STREPTOCOCCUS INTERMEDIUS  Final      Susceptibility   Streptococcus intermedius - MIC*    PENICILLIN <=0.06 SENSITIVE Sensitive     CEFTRIAXONE <=0.12 SENSITIVE Sensitive     ERYTHROMYCIN 2 RESISTANT Resistant     LEVOFLOXACIN 0.5 SENSITIVE Sensitive      VANCOMYCIN 0.25 SENSITIVE Sensitive     * ABUNDANT STREPTOCOCCUS INTERMEDIUS    Michel Bickers, [facility] [place] [facility] [phone] pager   [phone] cell 02/12/2017, 2:48 PM

## 2017-02-13 LAB — CULTURE, BLOOD (ROUTINE X 2)
Culture: NO GROWTH
Culture: NO GROWTH
SPECIAL REQUESTS: ADEQUATE
SPECIAL REQUESTS: ADEQUATE

## 2017-02-13 LAB — AEROBIC/ANAEROBIC CULTURE (SURGICAL/DEEP WOUND)

## 2017-02-13 LAB — AEROBIC/ANAEROBIC CULTURE W GRAM STAIN (SURGICAL/DEEP WOUND)

## 2017-02-16 ENCOUNTER — Other Ambulatory Visit: Payer: Self-pay | Admitting: Pharmacist

## 2017-02-21 ENCOUNTER — Encounter: Payer: Self-pay | Admitting: Internal Medicine

## 2017-02-21 ENCOUNTER — Telehealth: Payer: Self-pay | Admitting: *Deleted

## 2017-02-21 ENCOUNTER — Ambulatory Visit (INDEPENDENT_AMBULATORY_CARE_PROVIDER_SITE_OTHER): Payer: No Typology Code available for payment source | Admitting: Internal Medicine

## 2017-02-21 ENCOUNTER — Other Ambulatory Visit: Payer: Self-pay | Admitting: Internal Medicine

## 2017-02-21 DIAGNOSIS — K6812 Psoas muscle abscess: Secondary | ICD-10-CM

## 2017-02-21 DIAGNOSIS — R509 Fever, unspecified: Secondary | ICD-10-CM | POA: Diagnosis not present

## 2017-02-21 MED ORDER — LEVOFLOXACIN 500 MG PO TABS: 500.0000 mg | ORAL_TABLET | Freq: Every day | ORAL | 1 refills | Status: DC

## 2017-02-21 NOTE — Telephone Encounter (Signed)
I spoke with Ms. Comunale by phone this afternoon.  She has developed a splotchy red rash on her chest and is itching more.  I now strongly suspect that she has developed an allergic reaction to her ceftriaxone.  I have decided to stop the ceftriaxone and switch her to oral levofloxacin.  I spoke with her advanced Home care nurse, Lamar LaundrySonya, and gave an order to discontinue ceftriaxone.  I will leave her PICC and for now pending the results of blood cultures, CT scan, stool studies and observation off of ceftriaxone.

## 2017-02-21 NOTE — Progress Notes (Signed)
       [facility]  Patient Active Problem List   Diagnosis Date Noted  . Fever 02/21/2017  . 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 Psoas abscess, right (HCC) 02/07/2017    Patient's Medications  New Prescriptions   No medications on file  Previous Medications   ACETAMINOPHEN-CODEINE (TYLENOL #3) 300-30 MG TABLET    Take 1 tablet by mouth every 6 hours prn for pain   CEFTRIAXONE (ROCEPHIN) IVPB    Inject 2 g into the vein daily. Indication: L2-3 discitis and right psoas abscess Last Day of Therapy: 03/22/17 Labs - Once weekly:  CBC/D, BMP, CRP, ESR   CEFTRIAXONE 2 G IN SODIUM CHLORIDE 0.9 % 100 ML    Inject 2 g into the vein daily.   FLUTICASONE (FLONASE) 50 MCG/ACT NASAL SPRAY    Place 1 spray into both nostrils daily.   MELOXICAM (MOBIC) 15 MG TABLET    meloxicam 15 mg tablet   METHOCARBAMOL (ROBAXIN) 500 MG TABLET    TK 2 TS PO Q 6 TO 8 H PRN FOR INFLAMMATION AND PAIN   OXYCODONE-ACETAMINOPHEN (PERCOCET) 7.5-325 MG TABLET    Take 1 tablet by mouth every 4 (four) hours as needed for severe pain.   SENNA (SENOKOT) 8.6 MG TABS TABLET    Take 1 tablet (8.6 mg total) by mouth daily as needed for mild constipation.  Modified Medications   No medications on file  Discontinued Medications   No medications on file    Subjective: Joy Morris is seen on a work in basis.  She was recently hospitalized with right flank pain and was found to have L2-3 discitis complicated by a large, loculated right psoas abscess.  An abscess drain was placed.  Abscess cultures grew Streptococcus intermedius sensitive to ceftriaxone.  She has now completed day 14 of IV antibiotic therapy.  Three days ago she began having fever.  Initially her temperature went up to only 100 degrees but the last 2 nights she has had a fever of 103.7 degrees.  She has had chills and sweats.  She is also noted some frequent loose stools.  She is not having watery diarrhea.  She had one  episode of mild nausea 2 days ago.  Last night right after her ceftriaxone dose she developed intense generalized itching.  She did not have any rash.  She took diphenhydramine and it resolved.  Her right flank pain has improved.  She still notes some mild shortness of breath when laying on her right side. The weakness and numbness in her right leg has been improving slowly. Her drain was putting out >100 ccs daily in the hospital. Recently output has been down to about 30 cc daily.  Review of Systems: Review of Systems  Constitutional: Positive for chills, diaphoresis, fever and malaise/fatigue. Negative for weight loss.  HENT: Positive for congestion. Negative for sore throat.   Respiratory: Positive for shortness of breath. Negative for cough and sputum production.   Cardiovascular: Negative for chest pain.  Gastrointestinal: Positive for abdominal pain, diarrhea and nausea. Negative for heartburn and vomiting.  Genitourinary: Negative for dysuria and frequency.  Musculoskeletal: Negative for joint pain and myalgias.  Skin: Positive for itching. Negative for rash.  Neurological: Positive for sensory change and focal weakness. Negative for dizziness and headaches.    Past Medical History:  Diagnosis Date  . 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   Smoking status: Never Smoker  . Smokeless tobacco: Never Used  Substance Use Topics  . Alcohol use: No    Frequency: Never  . Drug use: No    Family History  Problem Relation Age of Onset  . Hypertension Mother   . Hyperlipidemia Mother   . Leukemia Father        CML    No Known Allergies  Objective: Vitals:   02/21/17 0944  BP: 119/78  Pulse: (!) 118  Temp: (!) 97.4 F (36.3 C)  TempSrc: Oral  Weight: 151 lb (68.5 kg)  Height: 5' 1" (1.549 m)   Body mass index is 28.53 kg/m.  Physical Exam  Constitutional: She is oriented to person, place, and time.  He is in good spirits.  She is in  no distress.  HENT:  Mouth/Throat: No oropharyngeal exudate.  Eyes: Conjunctivae are normal.  Neck: Neck supple.  Cardiovascular: Regular rhythm.  No murmur heard. She is tachycardic.  Pulmonary/Chest: Effort normal. She has no wheezes. She has no rales.  Abdominal: Soft. She exhibits no distension. There is tenderness.  She has a right, posterior flank drain with about 30 cc of brown, purulent fluid in the bag.  Musculoskeletal: Normal range of motion. She exhibits no edema or tenderness.  Neurological: She is alert and oriented to person, place, and time.  She walks slowly with the aid of a cane.  Skin: No rash noted.  Right arm PICC site appears normal.  Psychiatric: Mood and affect normal.    Lab Results    Problem List Items Addressed This Visit      Unprioritized   Fever    She has new onset fevers in the setting of treatment for streptococcal discitis and right psoas abscess.  Of course, the fever could be related to the abscess and the decreased drain output.  She is scheduled for follow-up CT scan and a visit in the IR drain clinic tomorrow morning.  She could have a PICC related bloodstream infection.  Blood cultures were obtained yesterday morning and pending.  With the itching she had last night she may be developing a cephalosporin allergy.  Finally, with her more frequent loose stools she could have early C. difficile colitis.  I talked about management options and we agreed to have her continue ceftriaxone for now.  She will premedicate tonight with ibuprofen and acetaminophen and can use diphenhydramine if she develops any more itching.  I will call her tomorrow morning [phone]) to review blood culture and CT scan results and see how she did overnight.      Relevant Orders   Clostridium Difficile by PCR       Michel Bickers, MD [facility] [phone] pager   [phone] cell 02/21/2017, 10:38 AM

## 2017-02-21 NOTE — Telephone Encounter (Signed)
Sonya, [facility] RN, only able to collect 1 blood culture. OK per Dr Orvan Falconerampbell. Andree CossHowell, Cam Dauphin M, RN

## 2017-02-21 NOTE — Telephone Encounter (Signed)
Patient called to report that she has a blotchy rash on her chest and her urine is dark. She just wanted the doctor to know as they just discussed this at office visit.

## 2017-02-21 NOTE — Assessment & Plan Note (Signed)
She has new onset fevers in the setting of treatment for streptococcal discitis and right psoas abscess.  Of course, the fever could be related to the abscess and the decreased drain output.  She is scheduled for follow-up CT scan and a visit in the IR drain clinic tomorrow morning.  She could have a PICC related bloodstream infection.  Blood cultures were obtained yesterday morning and pending.  With the itching she had last night she may be developing a cephalosporin allergy.  Finally, with her more frequent loose stools she could have early C. difficile colitis.  I talked about management options and we agreed to have her continue ceftriaxone for now.  She will premedicate tonight with ibuprofen and acetaminophen and can use diphenhydramine if she develops any more itching.  I will call her tomorrow morning [phone] to review blood culture and CT scan results and see how she did overnight.

## 2017-02-22 ENCOUNTER — Other Ambulatory Visit: Payer: Self-pay | Admitting: Radiology

## 2017-02-22 ENCOUNTER — Ambulatory Visit
Admission: RE | Admit: 2017-02-22 | Discharge: 2017-02-22 | Disposition: A | Discharge status: Home / Self Care | Payer: No Typology Code available for payment source | Source: Ambulatory Visit | Attending: General Surgery | Admitting: General Surgery

## 2017-02-22 ENCOUNTER — Other Ambulatory Visit (HOSPITAL_COMMUNITY): Payer: Self-pay | Admitting: Interventional Radiology

## 2017-02-22 ENCOUNTER — Other Ambulatory Visit: Payer: Self-pay | Admitting: General Surgery

## 2017-02-22 ENCOUNTER — Encounter: Payer: Self-pay | Admitting: *Deleted

## 2017-02-22 ENCOUNTER — Telehealth: Payer: Self-pay | Admitting: Internal Medicine

## 2017-02-22 ENCOUNTER — Ambulatory Visit
Admission: RE | Admit: 2017-02-22 | Discharge: 2017-02-22 | Disposition: A | Discharge status: Home / Self Care | Payer: No Typology Code available for payment source | Source: Ambulatory Visit | Attending: Internal Medicine | Admitting: Internal Medicine

## 2017-02-22 DIAGNOSIS — K6812 Psoas muscle abscess: Secondary | ICD-10-CM

## 2017-02-22 HISTORY — PX: IR RADIOLOGIST EVAL & MGMT: IMG5224

## 2017-02-22 MED ORDER — IOPAMIDOL (ISOVUE-300) INJECTION 61%
100.0000 mL | Freq: Once | INTRAVENOUS | Status: AC | PRN
  Administered 2017-02-22: 100 mL via INTRAVENOUS

## 2017-02-22 NOTE — Progress Notes (Signed)
Referring Physician(s): Campbell,J  Chief Complaint: The patient is seen in follow up today s/p CT guided drainage of a right psoas abscess on 02/08/17  History of present illness: Raymonde Hamblin is a 42 y.o. female with no significant past medical or surgical history who presented to [facility] on 02/07/17 with history of back pain since December of last year which  progressed with radiation down right upper leg in addition to paresthesias.  The pain persisted despite steroid use. 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  She underwent CT-guided drainage of the right psoas abscess on 02/08/17.  She presents today for follow-up clinic visit and to discuss latest CT findings. She continues to have some intermittent fevers, chills, sweats as well as RLQ discomfort with radiation down right thigh and associated paresthesias. When she lies on her right side she has some dyspnea and coughing. She uses a cane to assist with ambulation. Drain fluid cultures grew strept intermedius and she is currently on levaquin. She is flushing her drain once daily with 5-10 cc sterile NS and output has ranged from 30-50 cc daily. She is being followed by Dr. Orvan Falconer with ID service.      Past Medical History:  Diagnosis Date  . 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  Allergies: Patient has no known allergies.  Medications: Prior to Admission medications   Medication Sig Start Date End Date Taking? Authorizing Provider  acetaminophen-codeine (TYLENOL #3) 300-30 MG tablet Take 1 tablet by mouth every 6 hours prn for pain    [provider]  fluticasone (FLONASE) 50 MCG/ACT nasal spray Place 1 spray into both nostrils daily.    [provider]  levofloxacin (LEVAQUIN) 500 MG tablet Take 1 tablet (500 mg total) by mouth daily. 02/21/17    Cliffton Asters, MD  meloxicam (MOBIC) 15 MG tablet meloxicam 15 mg tablet    [provider]  methocarbamol (ROBAXIN) 500 MG tablet TK 2 TS PO Q 6 TO 8 H PRN FOR INFLAMMATION AND PAIN 01/08/17   [provider]  oxyCODONE-acetaminophen (PERCOCET) 7.5-325 MG tablet Take 1 tablet by mouth every 4 (four) hours as needed for severe pain. 02/12/17 02/12/18  Amin, Loura Halt, MD  senna (SENOKOT) 8.6 MG TABS tablet Take 1 tablet (8.6 mg total) by mouth daily as needed for mild constipation. Patient not taking: Reported on 02/21/2017 02/12/17   Dimple Nanas, MD     Family History  Problem Relation Age of Onset  . Hypertension Mother   . Hyperlipidemia Mother   . Leukemia Father        CML    Social History   Socioeconomic History  . Marital status: Married    Spouse name: Not on file  . Number of children: Not on file  . Years of education: Not on file  . Highest education level: Not on file  Social Needs  . Financial resource strain: Not on file  . Food insecurity - worry: Not on file  . Food insecurity - inability: Not on file  . Transportation needs - medical: Not on file  . Transportation needs - non-medical: Not on file  Occupational History  . Not on file  Tobacco Use  . Smoking status: Never Smoker  . Smokeless tobacco: Never Used  Substance and Sexual Activity  . Alcohol use: No  Frequency: Never  . Drug use: No  . Sexual activity: Not on file  Other Topics Concern  . Not on file  Social History Narrative  . Not on file     Vital Signs: Vitals:   02/22/17 1105  BP: (!) 141/63  Pulse: (!) 111  Resp: 14  Temp: 98.3 F (36.8 C)  SpO2: 99%    LMP 08/16/2015 (Approximate)   Physical Exam awake, alert; chest clear to auscultation bilaterally ;heart with sl tachycardic but reg rhythm; abd soft,+BS, mildly tender RLQ; rt flank drain intact, dressing dry, site NT, small amount cream colored fluid in drain bag  Imaging: No results  found.  Labs:  CBC: Recent Labs    02/08/17 0616 02/09/17 0607 02/10/17 0852 02/11/17 0658  WBC 15.7* 13.8* 15.0* 14.8*  HGB 8.5* 8.1* 8.0* 8.1*  HCT 26.1* 25.3* 25.4* 25.3*  PLT 637* 638* 599* 587*    COAGS: Recent Labs    02/08/17 0841  INR 1.24    BMP: Recent Labs    02/08/17 0616 02/09/17 0607 02/10/17 0852 02/11/17 0658  NA 138 137 135 133*  K 3.7 2.8* 3.2* 3.3*  CL 103 106 106 102  CO2 26 24 23 22   GLUCOSE 114* 112* 109* 107*  BUN 5* <5* <5* <5*  CALCIUM 8.0* 7.5* 7.6* 7.7*  CREATININE 0.50 0.52 0.48 0.47  GFRNONAA >60 >60 >60 >60  GFRAA >60 >60 >60 >60    LIVER FUNCTION TESTS: Recent Labs    02/07/17 1739  BILITOT 0.4  AST 27  ALT 27  ALKPHOS 124  PROT 7.1  ALBUMIN 2.3*    Assessment: 42 y.o. female with no significant past medical or surgical history who [facility] with history of back pain since December of last year which  progressed with radiation down right upper leg in addition to paresthesias.  The pain persisted despite steroid use. 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  She underwent CT-guided drainage of the right psoas abscess on 02/08/17.  She presents today for follow-up clinic visit and to discuss latest CT findings. She continues to have some intermittent fevers, chills, sweats as well as RLQ discomfort with radiation down right thigh and associated paresthesias. When she lies on her right side she has some dyspnea and coughing. She uses a cane to assist with ambulation. Drain fluid cultures grew strept intermedius and she is currently on levaquin. She is flushing her drain once daily with 5-10 cc sterile NS and output has ranged from 30-50 cc daily. She is being followed by Dr. Orvan Falconerampbell with ID service.  Repeat CT scan of abdomen and pelvis today reveals decreased size of drained psoas  collection but residual scattered fluid collections remain in abd/pelvis. Case d/w Dr. Orvan Falconerampbell and decision made to proceed with additional CT guided drainage of residual fluid collections [facility]. Pt aware of above and preprocedure instructions given.    Signed: D. Jeananne RamaKevin Allred, PA-C 02/22/2017, 10:51 AM   Please refer to Dr. Kirt BoysWatt's  attestation of this note for management and plan.      Patient ID: Vickii PennaCorie Balliet, female   DOB: 12/21/1975, 42 y.o.   MRN: [id number]

## 2017-02-22 NOTE — Telephone Encounter (Signed)
I spoke with Ms. Morrisette today.  She did not have any fever again last night and the red rash and itching are resolving.  I suspect that her fever and rash were due to ceftriaxone.  I plan on continuing levofloxacin for now.  She underwent her repeat scan this morning I have reviewed those results with Dr. Rachel MouldsJay Watt.  His findings are listed below.  IMPRESSION: 1. Unchanged positioning of right flank percutaneous drainage catheter with near complete resolution of dominant component of right-sided psoas abscess/fluid collection. 2. Persistent peripherally and hand seen abscesses within the caudal aspect of the right iliopsoas musculature with suspected serpiginous communication with an additional larger collection within medial aspect of the proximal right femur. 3. Serpiginous fluid collections within the right and midline of the ventral abdominal wall with suspected communication with several serpiginous fluid collections within the bilateral lower pelvis. Dominant fluid collection within the right lower abdomen measures approximately 12.8 cm while additional collection within the lower aspect of the rectus abdominal musculature measures approximately 6.9 cm.  Above findings discussed with Dr. Orvan Falconerampbell at the time of image acquisition.  PLAN: - Per my discussion with Dr. Orvan Falconerampbell, the patient will be scheduled for CT-guided percutaneous [facility]. We will arrange for the patient to undergo at least a 23 hour observation/admission with the [facility].   Electronically Signed   By: Simonne ComeJohn  Watts M.D.   On: 02/22/2017 11:43  She was initially diagnosed with a limited view MRI of her lumbar spine and I suspect that these other fluid collections were missed.  The only CT scan she had here previously was when the drain was placed and it was only limited images as well.  She is in agreement with the plan to come back in the  hospital tomorrow and have additional drains placed.  I would like to have the fluid is submitted for Gram stain and routine culture.

## 2017-02-23 ENCOUNTER — Other Ambulatory Visit: Payer: Self-pay | Admitting: Pharmacist

## 2017-02-23 ENCOUNTER — Ambulatory Visit (HOSPITAL_COMMUNITY)
Admission: RE | Admit: 2017-02-23 | Discharge: 2017-02-23 | Disposition: A | Discharge status: Home / Self Care | Payer: No Typology Code available for payment source | Source: Ambulatory Visit | Attending: Interventional Radiology | Admitting: Interventional Radiology

## 2017-02-23 ENCOUNTER — Other Ambulatory Visit: Payer: Self-pay

## 2017-02-23 ENCOUNTER — Encounter (HOSPITAL_COMMUNITY): Payer: Self-pay

## 2017-02-23 ENCOUNTER — Observation Stay (HOSPITAL_COMMUNITY)
Admission: RE | Admit: 2017-02-23 | Discharge: 2017-02-24 | Disposition: A | Discharge status: Home / Self Care | Payer: No Typology Code available for payment source | Source: Ambulatory Visit | Attending: Internal Medicine | Admitting: Internal Medicine

## 2017-02-23 VITALS — BP 128/76 | HR 102 | Temp 98.0°F | Resp 16 | Ht 61.0 in | Wt 150.0 lb

## 2017-02-23 DIAGNOSIS — Z881 Allergy status to other antibiotic agents status: Secondary | ICD-10-CM | POA: Insufficient documentation

## 2017-02-23 DIAGNOSIS — Z79899 Other long term (current) drug therapy: Secondary | ICD-10-CM | POA: Insufficient documentation

## 2017-02-23 DIAGNOSIS — M4626 Osteomyelitis of vertebra, lumbar region: Secondary | ICD-10-CM

## 2017-02-23 DIAGNOSIS — T361X5A Adverse effect of cephalosporins and other beta-lactam antibiotics, initial encounter: Secondary | ICD-10-CM | POA: Diagnosis not present

## 2017-02-23 DIAGNOSIS — Z95828 Presence of other vascular implants and grafts: Secondary | ICD-10-CM

## 2017-02-23 DIAGNOSIS — L270 Generalized skin eruption due to drugs and medicaments taken internally: Secondary | ICD-10-CM | POA: Diagnosis not present

## 2017-02-23 DIAGNOSIS — K6812 Psoas muscle abscess: Secondary | ICD-10-CM | POA: Diagnosis not present

## 2017-02-23 DIAGNOSIS — B954 Other streptococcus as the cause of diseases classified elsewhere: Secondary | ICD-10-CM | POA: Diagnosis not present

## 2017-02-23 DIAGNOSIS — M4646 Discitis, unspecified, lumbar region: Secondary | ICD-10-CM | POA: Diagnosis not present

## 2017-02-23 DIAGNOSIS — B999 Unspecified infectious disease: Secondary | ICD-10-CM | POA: Insufficient documentation

## 2017-02-23 DIAGNOSIS — L0291 Cutaneous abscess, unspecified: Secondary | ICD-10-CM

## 2017-02-23 DIAGNOSIS — R112 Nausea with vomiting, unspecified: Secondary | ICD-10-CM | POA: Insufficient documentation

## 2017-02-23 DIAGNOSIS — Z978 Presence of other specified devices: Secondary | ICD-10-CM

## 2017-02-23 DIAGNOSIS — M868X8 Other osteomyelitis, other site: Principal | ICD-10-CM | POA: Insufficient documentation

## 2017-02-23 DIAGNOSIS — L02211 Cutaneous abscess of abdominal wall: Secondary | ICD-10-CM | POA: Insufficient documentation

## 2017-02-23 DIAGNOSIS — N739 Female pelvic inflammatory disease, unspecified: Secondary | ICD-10-CM | POA: Diagnosis not present

## 2017-02-23 LAB — PROTIME-INR
INR: 0.97
PROTHROMBIN TIME: 12.8 s (ref 11.4–15.2)

## 2017-02-23 LAB — CBC
HCT: 28.5 % — ABNORMAL LOW (ref 36.0–46.0)
Hemoglobin: 9.1 g/dL — ABNORMAL LOW (ref 12.0–15.0)
MCH: 27.6 pg (ref 26.0–34.0)
MCHC: 31.9 g/dL (ref 30.0–36.0)
MCV: 86.4 fL (ref 78.0–100.0)
PLATELETS: 271 10*3/uL (ref 150–400)
RBC: 3.3 MIL/uL — ABNORMAL LOW (ref 3.87–5.11)
RDW: 15.7 % — ABNORMAL HIGH (ref 11.5–15.5)
WBC: 4.5 10*3/uL (ref 4.0–10.5)

## 2017-02-23 MED ORDER — SODIUM CHLORIDE 0.9 % IV SOLN
INTRAVENOUS | Status: DC
  Administered 2017-02-23: 14:00:00 via INTRAVENOUS

## 2017-02-23 MED ORDER — MIDAZOLAM HCL 2 MG/2ML IJ SOLN
INTRAMUSCULAR | Status: AC | PRN
  Administered 2017-02-23: 0.5 mg via INTRAVENOUS
  Administered 2017-02-23: 1 mg via INTRAVENOUS
  Administered 2017-02-23: 0.5 mg via INTRAVENOUS
  Administered 2017-02-23: 1 mg via INTRAVENOUS

## 2017-02-23 MED ORDER — OXYCODONE-ACETAMINOPHEN 7.5-325 MG PO TABS
1.0000 | ORAL_TABLET | ORAL | Status: DC | PRN
  Administered 2017-02-23 – 2017-02-24 (×4): 1 via ORAL
  Filled 2017-02-23 (×4): qty 1

## 2017-02-23 MED ORDER — FENTANYL CITRATE (PF) 100 MCG/2ML IJ SOLN
INTRAMUSCULAR | Status: AC
  Filled 2017-02-23: qty 6

## 2017-02-23 MED ORDER — LEVOFLOXACIN 500 MG PO TABS
500.0000 mg | ORAL_TABLET | ORAL | Status: DC
  Administered 2017-02-23: 500 mg via ORAL
  Filled 2017-02-23: qty 1

## 2017-02-23 MED ORDER — LIDOCAINE HCL 1 % IJ SOLN
INTRAMUSCULAR | Status: AC | PRN
  Administered 2017-02-23 (×2): 10 mL

## 2017-02-23 MED ORDER — MIDAZOLAM HCL 2 MG/2ML IJ SOLN
INTRAMUSCULAR | Status: AC
  Filled 2017-02-23: qty 6

## 2017-02-23 MED ORDER — SODIUM CHLORIDE 0.9% FLUSH
5.0000 mL | Freq: Three times a day (TID) | INTRAVENOUS | Status: DC
  Administered 2017-02-23 – 2017-02-24 (×2): 5 mL

## 2017-02-23 MED ORDER — FENTANYL CITRATE (PF) 100 MCG/2ML IJ SOLN
INTRAMUSCULAR | Status: AC | PRN
  Administered 2017-02-23: 25 ug via INTRAVENOUS
  Administered 2017-02-23 (×2): 50 ug via INTRAVENOUS
  Administered 2017-02-23: 25 ug via INTRAVENOUS

## 2017-02-23 NOTE — Progress Notes (Signed)
Dr Roda ShuttersXu paged regarding diet order for patient. Lina SarBeth Nalleli Largent, RN

## 2017-02-23 NOTE — Sedation Documentation (Signed)
MD suturing drains in place at this time

## 2017-02-23 NOTE — Progress Notes (Signed)
Report called to Terri RN, Patient going to 1524. Dr. Orvan Falconerampbell in with patient.

## 2017-02-23 NOTE — Progress Notes (Signed)
[facility] services for [name]. Pt is receiving Rocephin 2gm IV q24h and  Vancomycin 750mg  IV q12H through March 23, 2017 [facility] will follow pt while here to support DC when ordered.  If patient discharges after hours, please call [phone].   Joy Morris 02/23/2017, 7:04 PM

## 2017-02-23 NOTE — Sedation Documentation (Signed)
MD about to insert drain. Pre medicated for drain insertion

## 2017-02-23 NOTE — Progress Notes (Signed)
Patient ID: Joy Morris, female   DOB: 1975-11-01, 42 y.o.   MRN: [id number]          [facility]  Date of Admission:  02/23/2017   Total days of antibiotics 16        Day 3 levofloxacin         ASSESSMENT: This is a very unusual situation with multiple large abscesses both posteriorly and anteriorly complicating lumbar infection.  She is doing well after placement of a second and third drain.  I strongly suspect that the abscesses are all due to Streptococcus intermedius which is susceptible to fluoroquinolones.  Her recent fever and rash were due to an allergic reaction to ceftriaxone.  I plan on continuing oral levofloxacin and having her PICC line removed.  I see no reason why she could not be discharged home tomorrow.  I will review cultures on Monday, 02/26/2017, and call her to make follow-up arrangements in my clinic.  PLAN: 1. Continue levofloxacin pending culture results 2. Removed PICC 3. Arrange follow-up next week 4. Please call Dr. Judyann Munson [phone] for any infectious disease questions this weekend  Active Problems:   Osteomyelitis of lumbar spine (HCC)   Psoas abscess, right (HCC)   Pelvic abscess in female   Scheduled Meds: . sodium chloride flush  5 mL Intracatheter Q8H   Continuous Infusions: PRN Meds:.   SUBJECTIVE: Joy Morris developed back pain about 2 months ago.  When it persisted she had an outpatient MRI which showed L2-3 discitis complicated by a right psoas abscess.  She was admitted and had a CT-guided drain placed.  Cultures grew Streptococcus intermedius sensitive to ceftriaxone.  She was discharged on IV ceftriaxone and followed up with me 2 days ago.  Her drain output was decreasing but she had developed new fever and pruritic rash.  I obtained blood cultures and changed ceftriaxone to levofloxacin.  Fevers resolved promptly and her rash is fading.  Her itching has resolved.  Blood cultures remain negative.   Yesterday she underwent routine follow-up CT scan to assess her drain.  The psoas collection was markedly improved but at least 2 other large collections were noted that were missed on her initial MRI and CT.  Today she underwent placement of 2 new drains, one in the right pelvic collection and one in the left abdominal wall collection.  She is going to be readmitted and observed overnight.  Review of Systems: Review of Systems  Constitutional: Positive for malaise/fatigue. Negative for chills, diaphoresis and fever.  Respiratory: Negative for cough, sputum production and shortness of breath.   Cardiovascular: Negative for chest pain.  Gastrointestinal: Negative for abdominal pain, diarrhea, nausea and vomiting.  Genitourinary: Negative for dysuria.  Musculoskeletal: Positive for back pain.  Skin: Positive for rash. Negative for itching.  Neurological: Negative for dizziness and headaches.    Allergies  Allergen Reactions  . Ceftriaxone Itching and Rash    Fever, soft stool    OBJECTIVE: Vitals:   02/23/17 1710  BP: 132/82  Pulse: 99  Resp: 20  SpO2: 96%   There is no height or weight on file to calculate BMI.  Physical Exam  Constitutional: She is oriented to person, place, and time.  She is currently resting on a stretcher in short stay waiting for her bed.  She is in good spirits.  Her husband is with her.  Cardiovascular: Normal rate and regular rhythm.  No murmur heard. Pulmonary/Chest: Effort normal. She has no  wheezes. She has no rales.  Abdominal: Soft.  Drains are in place.  The right drain fluid is purulent and brown.  The abdominal wall drain fluid is purulent and pink.  Musculoskeletal:  She still has a little bit of numbness and weakness in her right leg but this has improved.  Neurological: She is alert and oriented to person, place, and time.  Skin: Rash noted.  She has a small area of fading erythema on her anterior chest.  Right arm PICC remains in although  it is not been used in the past 3 days.  Psychiatric: Mood and affect normal.    Lab Results Lab Results  Component Value Date   WBC 4.5 02/23/2017   HGB 9.1 (L) 02/23/2017   HCT 28.5 (L) 02/23/2017   MCV 86.4 02/23/2017   PLT 271 02/23/2017    Lab Results  Component Value Date   CREATININE 0.47 02/11/2017   BUN <5 (L) 02/11/2017   NA 133 (L) 02/11/2017   K 3.3 (L) 02/11/2017   CL 102 02/11/2017   CO2 22 02/11/2017    Lab Results  Component Value Date   ALT 27 02/07/2017   AST 27 02/07/2017   ALKPHOS 124 02/07/2017   BILITOT 0.4 02/07/2017     Microbiology: No results found for this or any previous visit (from the past 240 hour(s)).  Cliffton AstersJohn Kaian Fahs, MD [facility] [phone] pager   [phone] 02/23/2017, 5:38 PM

## 2017-02-23 NOTE — Progress Notes (Signed)
Pt. Taken to 1524 via stretcher.

## 2017-02-23 NOTE — H&P (Signed)
Referring Physician(s): Campbell,J  Supervising Physician: Ruel FavorsShick, Trevor  Patient Status:  [facility]  Chief Complaint: Abdominal abscesses   Subjective: Patient familiar to IR service from prior right psoas abscess drain placement on 02/08/17.  She was evaluated in IR clinic yesterday and noted to have additional undrained abdominal/pelvic abscesses back pain, nausea, vomiting or bleeding.  She continues to have some right lower quadrant discomfort..  There was near complete resolution of the currently drained psoas abscess.  She presents again today for CT-guided drainage of the additional fluid collections following discussion with Dr. Orvan Falconerampbell followed by hospital admission.  She currently denies fever, headache, chest pain, dyspnea, cough, N/V or bleeding. She continues to have some RLQ discomfort with some radiation down rt thigh and occ rt flank. Past Medical History:  Diagnosis Date  . 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 IR RADIOLOGIST EVAL & MGMT  02/22/2017     Allergies: Ceftriaxone  Medications: Prior to Admission medications   Medication Sig Start Date End Date Taking? Authorizing Provider  acetaminophen-codeine (TYLENOL #3) 300-30 MG tablet Take 1 tablet by mouth every 6 hours prn for pain   Yes [provider]  fluticasone (FLONASE) 50 MCG/ACT nasal spray Place 1 spray into both nostrils daily.   Yes [provider]  levofloxacin (LEVAQUIN) 500 MG tablet Take 1 tablet (500 mg total) by mouth daily. 02/21/17  Yes Cliffton Astersampbell, John, MD  methocarbamol (ROBAXIN) 500 MG tablet TK 2 TS PO Q 6 TO 8 H PRN FOR INFLAMMATION AND PAIN 01/08/17  Yes [provider]  oxyCODONE-acetaminophen (PERCOCET) 7.5-325 MG tablet Take 1 tablet by mouth every 4 (four) hours as needed for severe pain. 02/12/17 02/12/18 Yes Amin, Loura HaltAnkit Chirag, MD  meloxicam (MOBIC) 15 MG tablet meloxicam 15 mg tablet    [provider]  senna (SENOKOT) 8.6 MG TABS tablet Take 1 tablet (8.6 mg total) by mouth daily as needed for mild constipation. Patient not taking: Reported on 02/21/2017 02/12/17   Dimple NanasAmin, Ankit Chirag, MD     Vital Signs: BP 122/87   Pulse (!) 107   Temp 98.3 F (36.8 C) (Oral)   Resp 16   Ht 5\' 1"  (1.549 m)   Wt 151 lb (68.5 kg)   LMP 08/16/2015 (Approximate)   SpO2 98%   BMI 28.53 kg/m   Physical Exam awake/alert; chest- CTA bilat; heart- sl tachy but reg rhythm; abd- soft,+BS, mild-mod tender RLQ; rt flank drain intact, small amt cream colored fluid in drain bag; no LE edema  Imaging: Ct Abdomen Pelvis W Contrast  Result Date: 02/22/2017 CLINICAL DATA:  History of right psoas abscess, post percutaneous drainage catheter placement on 02/08/2017. EXAM: CT ABDOMEN AND PELVIS WITH CONTRAST TECHNIQUE: Multidetector CT imaging of the abdomen and pelvis was performed using the standard protocol following bolus administration of intravenous contrast. CONTRAST:  100mL ISOVUE-300 IOPAMIDOL (ISOVUE-300) INJECTION 61% COMPARISON:  Lumbar spine MRI - 01/31/2017; CT-guided right psoas abscess drainage catheter placement - 02/08/2017 FINDINGS: Lower chest: Limited visualization of lower thorax demonstrates minimal subsegmental atelectasis within the bilateral lung bases and bilateral costophrenic angles, left greater than right. No discrete focal airspace opacities. No pleural effusion. Normal heart size. No pericardial effusion. Tip of presumed PICC line terminates within the superior cavoatrial junction. Hepatobiliary: Normal hepatic contour. No discrete hepatic lesions. Normal appearance of the gallbladder given degree distention. No radiopaque gallstones. No intra extrahepatic bili duct dilatation. No ascites. Pancreas:  Normal appearance of the pancreas Spleen: Borderline splenomegaly with the spleen measuring 12.1 cm in length (coronal image 60, series 3). Adrenals/Urinary Tract: There is symmetric  enhancement of the bilateral kidneys. No definite renal stones this postcontrast examination. No discrete renal lesions. No urinary obstruction or perinephric stranding. Normal appearance the bilateral adrenal glands. There is diffuse thickening the urinary bladder wall, potentially accentuated due to underdistention. Stomach/Bowel: Scattered minimal colonic diverticulosis without evidence of diverticulitis. Normal appearance of the terminal ileum and retrocecal appendix. No pneumoperitoneum, pneumatosis or portal venous gas. Vascular/Lymphatic: Normal caliber of the abdominal aorta. The major branch vessels of the abdominal aorta appear patent on this non CTA examination. No bulky retroperitoneal, mesenteric, pelvic or inguinal lymphadenopathy. Reproductive: The uterus appears slightly diminutive. No discrete adnexal lesion. No free fluid in the pelvic cul-de-sac. Other: Note is made of a small mesenteric fat containing periumbilical hernia. There is a minimal amount of subcutaneous edema about the midline of the low back. Musculoskeletal: Unchanged positioning right flank percutaneous drainage catheter with near resolution of dominant component psoas abscess/fluid collection. Unfortunately, there is persistent peripherally enhancing fluid collection within the more caudal aspect of the right iliopsoas musculature with dominant component measuring approximately 2.7 x 1.7 cm (image 59, series 6) which may communicate with an additional larger collection within the soft tissues of the medial aspect of the proximal right femur measuring approximately 4.6 x 1.5 cm (image 84, series 2). There are serpiginous fluid collections within the ventral aspect of the right lower abdomen/pelvis measuring approximately 6.0 x 5.3 x 12.8 cm (axial image 59, series 2, coronal image 43, series 3) as well as a serpiginous fluid collection within the inferior aspect of the rectus abdominal musculature measuring approximately 6.9 x 4.2 x  5.0 cm (axial image 67, series 2, coronal image 37, series 3). The dominant midline rectus component appears to communicate with several additional serpiginous fluid collection within the ventral aspect of the lower pelvis (best seen on coronal images 49 through 66, series 3) with dominant right-sided collection measuring approximately 4.1 x 1.4 cm (image 75, series 2) and dominant left-sided component measuring approximately 2.5 x 1.3 cm (image 76, series 2). IMPRESSION: 1. Unchanged positioning of right flank percutaneous drainage catheter with near complete resolution of dominant component of right-sided psoas abscess/fluid collection. 2. Persistent peripherally and hand seen abscesses within the caudal aspect of the right iliopsoas musculature with suspected serpiginous communication with an additional larger collection within medial aspect of the proximal right femur. 3. Serpiginous fluid collections within the right and midline of the ventral abdominal wall with suspected communication with several serpiginous fluid collections within the bilateral lower pelvis. Dominant fluid collection within the right lower abdomen measures approximately 12.8 cm while additional collection within the lower aspect of the rectus abdominal musculature measures approximately 6.9 cm. Above findings discussed with Dr. Orvan Falconer at the time of image acquisition. PLAN: - Per my discussion with Dr. Orvan Falconer, the patient will be scheduled for CT-guided percutaneous drainage catheter placement tomorrow afternoon at [facility]. We will arrange for the patient to undergo at least a 23 hour observation/admission with the [facility]. Electronically Signed   By: Simonne Come M.D.   On: 02/22/2017 11:43   Ir Radiologist Eval & Mgmt  Result Date: 02/22/2017 Please refer to notes tab for details about interventional procedure. (Op Note)   Labs:  CBC: Recent Labs    02/09/17 0607 02/10/17 0852 02/11/17 0658 02/23/17 1317    WBC 13.8* 15.0* 14.8* 4.5  HGB 8.1* 8.0* 8.1* 9.1*  HCT 25.3* 25.4* 25.3* 28.5*  PLT 638* 599* 587* 271    COAGS: Recent Labs    02/08/17 0841 02/23/17 1317  INR 1.24 0.97    BMP: Recent Labs    02/08/17 0616 02/09/17 0607 02/10/17 0852 02/11/17 0658  NA 138 137 135 133*  K 3.7 2.8* 3.2* 3.3*  CL 103 106 106 102  CO2 26 24 23 22   GLUCOSE 114* 112* 109* 107*  BUN 5* <5* <5* <5*  CALCIUM 8.0* 7.5* 7.6* 7.7*  CREATININE 0.50 0.52 0.48 0.47  GFRNONAA >60 >60 >60 >60  GFRAA >60 >60 >60 >60    LIVER FUNCTION TESTS: Recent Labs    02/07/17 1739  BILITOT 0.4  AST 27  ALT 27  ALKPHOS 124  PROT 7.1  ALBUMIN 2.3*    Assessment and Plan: Patient with history of complex right psoas abscess, status post image guided drainage on 02/08/17; presents now with additional undrained abdominal/pelvic abscesses and is scheduled for CT guided drain(s) placements today.  Also followed by Dr. Orvan Falconer of the ID service.  Previous drain fluid cultures grew Streptococcus intermedius.  Patient currently on Levaquin.Risks and benefits discussed with the patient/spouse including bleeding, infection, damage to adjacent structures, bowel perforation/fistula connection, and sepsis.  All of the patient's questions were answered, patient is agreeable to proceed. Consent signed and in chart.  Patient aware that following drain placement she will be admitted to the hospital by [facility]   Electronically Signed: D. Jeananne Rama, PA-C 02/23/2017, 3:03 PM   I spent a total of 25 minutes at the the patient's bedside AND on the patient's hospital floor or unit, greater than 50% of which was counseling/coordinating care for abdominal abscess drain placements

## 2017-02-23 NOTE — Procedures (Signed)
Pelvic abscesses  S/p 2 CT drains  cx sent No comp Stable Full report in pacs

## 2017-02-24 DIAGNOSIS — M868X8 Other osteomyelitis, other site: Secondary | ICD-10-CM | POA: Diagnosis not present

## 2017-02-24 DIAGNOSIS — N739 Female pelvic inflammatory disease, unspecified: Secondary | ICD-10-CM

## 2017-02-24 MED ORDER — OXYCODONE-ACETAMINOPHEN 7.5-325 MG PO TABS: 1.0000 | ORAL_TABLET | ORAL | 0 refills | Status: DC | PRN

## 2017-02-24 MED ORDER — LEVOFLOXACIN 500 MG PO TABS: 500.0000 mg | ORAL_TABLET | ORAL | 0 refills | Status: DC

## 2017-02-24 NOTE — Care Management Note (Signed)
Case Management Note  Patient Details  Name: Joy Morris MR  N: [id number] Date of Birth: 06/26/1975  Subjective/Objective:  Psoas abscess, with lumbar osteomyelitis post drain placement on 02/08/2017                   Action/Plan: [facility]. Contacted attending go update orders.    Expected Discharge Date:  02/24/17               Expected Discharge Plan:  Home w Home Health Services  In-House Referral:  NA  Discharge planning Services  CM Consult  Post Acute Care Choice:  Home Health Choice offered to:  Patient  DME Arranged:  N/A DME Agency:  NA  HH Arranged:  RN HH Agency:  [facility]  Status of Service:  Completed, signed off  If discussed at Long Length of Stay Meetings, dates discussed:    Additional Comments:  Joy Morris, Joy Delawder Ellen, RN 02/24/2017, 3:41 PM

## 2017-02-24 NOTE — Discharge Summary (Signed)
Joy Morris, is a 42 y.o. female  DOB 1975/05/09  MRN [id number].  Admission date:  02/23/2017  Admitting Physician  Haydee Monicaachal A David, MD  Discharge Date:  02/24/2017   Primary MD  Patient, No Pcp Per  Recommendations for primary care physician for things to follow:  Follow-up on cultures Check CBC Dr. Cliffton AstersJohn Campbell infectious disease within 1 week  Admission Diagnosis  Infection [B99.9]   Discharge Diagnosis  Infection [B99.9]    Active Problems:   Osteomyelitis of lumbar spine (HCC)   Psoas abscess, right (HCC)   Pelvic abscess in female      Past Medical History:  Diagnosis Date  . 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 IR RADIOLOGIST EVAL & MGMT  02/22/2017       HPI  from the history and physical done on the day of admission:  42 year old lady who had been admitted to the hospital initially on 02/07/2017 and discharged home on 02/12/2017 after management of right psoas abscess status with lumbar osteomyelitis post drain placement on 02/08/2017. She'll discharge home on IV Rocephin, with emphysematous disease follow-up.  She had to be readmitted on account of complaints of nausea vomiting with back pain and noted to have undrained abdominal/pelvic abscesses during evaluation at IR clinic on 02/22/2017, and was directly admitted to hospitalist service for further management.        Hospital Course:   On admission patient's paternal supportive care including antibiotics with pain control and underwent CT-guided drainage of catheter placement yesterday with good tolerance. She was seen in consultation by Dr. Cliffton AstersJohn Campbell of infectious disease who strongly suspected that the abscesses were due to Streptococcus intermedius which is susceptible to fluoroquinolones and recommended descriptors are PICC line and to continue oral cephalexin  to completion with plans to follow-up in the office with him.   Discharge Condition: Stable  Follow UP Dr. Cliffton AstersJohn Campbell of infiltrative disease, in 1 week     Consults obtained -interventional radiology, infectious disease  Diet and Activity recommendation:  As advised  Discharge Instructions        Discharge Medications     Allergies as of 02/24/2017      Reactions   Ceftriaxone Itching, Rash   Fever, soft stool      Medication List    STOP taking these medications   acetaminophen-codeine 300-30 MG tablet Commonly known as:  TYLENOL #3   fluticasone 50 MCG/ACT nasal spray Commonly known as:  FLONASE     TAKE these medications   levofloxacin 500 MG tablet Commonly known as:  LEVAQUIN Take 1 tablet (500 mg total) by mouth daily. What changed:  when to take this   methocarbamol 500 MG tablet Commonly known as:  ROBAXIN TK 2 TS PO Q 6 TO 8 H PRN FOR INFLAMMATION AND PAIN   oxyCODONE-acetaminophen 7.5-325 MG tablet Commonly known as:  PERCOCET Take 1 tablet by mouth every 4 (four) hours as needed for moderate pain or  severe pain. What changed:  reasons to take this   senna 8.6 MG Tabs tablet Commonly known as:  SENOKOT Take 1 tablet (8.6 mg total) by mouth daily as needed for mild constipation.       Major procedures and Radiology Reports - PLEASE review detailed and final reports for all details, in brief -   Ct Abdomen Pelvis W Contrast  Result Date: 02/22/2017 CLINICAL DATA:  History of right psoas abscess, post percutaneous drainage catheter placement on 02/08/2017. EXAM: CT ABDOMEN AND PELVIS WITH CONTRAST TECHNIQUE: Multidetector CT imaging of the abdomen and pelvis was performed using the standard protocol following bolus administration of intravenous contrast. CONTRAST:  ISOVUE-300 IOPAMIDOL (ISOVUE-300) INJECTION 61% COMPARISON:  Lumbar spine MRI - 01/31/2017; CT-guided right psoas abscess drainage catheter placement - 02/08/2017 FINDINGS:  Lower chest: Limited visualization of lower thorax demonstrates minimal subsegmental atelectasis within the bilateral lung bases and bilateral costophrenic angles, left greater than right. No discrete focal airspace opacities. No pleural effusion. Normal heart size. No pericardial effusion. Tip of presumed PICC line terminates within the superior cavoatrial junction. Hepatobiliary: Normal hepatic contour. No discrete hepatic lesions. Normal appearance of the gallbladder given degree distention. No radiopaque gallstones. No intra extrahepatic bili duct dilatation. No ascites. Pancreas: Normal appearance of the pancreas Spleen: Borderline splenomegaly with the spleen measuring 12.1 cm in length (coronal image 60, series 3). Adrenals/Urinary Tract: There is symmetric enhancement of the bilateral kidneys. No definite renal stones this postcontrast examination. No discrete renal lesions. No urinary obstruction or perinephric stranding. Normal appearance the bilateral adrenal glands. There is diffuse thickening the urinary bladder wall, potentially accentuated due to underdistention. Stomach/Bowel: Scattered minimal colonic diverticulosis without evidence of diverticulitis. Normal appearance of the terminal ileum and retrocecal appendix. No pneumoperitoneum, pneumatosis or portal venous gas. Vascular/Lymphatic: Normal caliber of the abdominal aorta. The major branch vessels of the abdominal aorta appear patent on this non CTA examination. No bulky retroperitoneal, mesenteric, pelvic or inguinal lymphadenopathy. Reproductive: The uterus appears slightly diminutive. No discrete adnexal lesion. No free fluid in the pelvic cul-de-sac. Other: Note is made of a small mesenteric fat containing periumbilical hernia. There is a minimal amount of subcutaneous edema about the midline of the low back. Musculoskeletal: Unchanged positioning right flank percutaneous drainage catheter with near resolution of dominant component psoas  abscess/fluid collection. Unfortunately, there is persistent peripherally enhancing fluid collection within the more caudal aspect of the right iliopsoas musculature with dominant component measuring approximately 2.7 x 1.7 cm (image 59, series 6) which may communicate with an additional larger collection within the soft tissues of the medial aspect of the proximal right femur measuring approximately 4.6 x 1.5 cm (image 84, series 2). There are serpiginous fluid collections within the ventral aspect of the right lower abdomen/pelvis measuring approximately 6.0 x 5.3 x 12.8 cm (axial image 59, series 2, coronal image 43, series 3) as well as a serpiginous fluid collection within the inferior aspect of the rectus abdominal musculature measuring approximately 6.9 x 4.2 x 5.0 cm (axial image 67, series 2, coronal image 37, series 3). The dominant midline rectus component appears to communicate with several additional serpiginous fluid collection within the ventral aspect of the lower pelvis (best seen on coronal images 49 through 66, series 3) with dominant right-sided collection measuring approximately 4.1 x 1.4 cm (image 75, series 2) and dominant left-sided component measuring approximately 2.5 x 1.3 cm (image 76, series 2). IMPRESSION: 1. Unchanged positioning of right flank percutaneous drainage catheter with near  complete resolution of dominant component of right-sided psoas abscess/fluid collection. 2. Persistent peripherally and hand seen abscesses within the caudal aspect of the right iliopsoas musculature with suspected serpiginous communication with an additional larger collection within medial aspect of the proximal right femur. 3. Serpiginous fluid collections within the right and midline of the ventral abdominal wall with suspected communication with several serpiginous fluid collections within the bilateral lower pelvis. Dominant fluid collection within the right lower abdomen measures approximately 12.8  cm while additional collection within the lower aspect of the rectus abdominal musculature measures approximately 6.9 cm. Above findings discussed with Dr. Orvan Falconer at the time of image acquisition. PLAN: - Per my discussion with Dr. Orvan Falconer, the patient will be scheduled for CT-guided percutaneous drainage catheter placement tomorrow afternoon at [facility]. We will arrange for the patient to undergo at least a 23 hour observation/admission with the [facility]. Electronically Signed   By: Simonne Come M.D.   On: 02/22/2017 11:43   Ct Image Guided Fluid Drain By Catheter  Result Date: 02/23/2017 INDICATION: Right lower quadrant and anterior pelvic abscesses EXAM: CT GUIDED DRAINAGE OF  2 PELVIC ABSCESSES MEDICATIONS: The patient is BEING admitted to the hospital and receiving intravenous antibiotics. She currently is on oral Levaquin. ANESTHESIA/SEDATION: 3.0 mg IV Versed 150 mcg IV Fentanyl Moderate Sedation Time:  13 minutes The patient was continuously monitored during the procedure by the interventional radiology nurse under my direct supervision. COMPLICATIONS: None immediate. TECHNIQUE: Informed written consent was obtained from the patient after a thorough discussion of the procedural risks, benefits and alternatives. All questions were addressed. Maximal Sterile Barrier Technique was utilized including caps, mask, sterile gowns, sterile gloves, sterile drape, hand hygiene and skin antiseptic. A timeout was performed prior to the initiation of the procedure. PROCEDURE: Previous imaging reviewed. Patient positioned supine. Noncontrast localization CT performed. The right lower quadrant anterior pelvic abscess were localized. Overlying areas were marked. Right lower quadrant pelvic abscess drain: Under sterile conditions and local anesthesia, CT guidance was utilized to advance an 18 gauge 10 cm needle into the right lower quadrant abscess. Needle position confirmed with C T. Syringe aspiration  yielded purulent fluid. Sample sent for culture. Guidewire inserted followed by tract dilatation to insert a 12 [place] drain. Drain catheter position confirmed with CT. Catheter secured with a Prolene suture and connected to external suction bulb. Sterile dressing applied. Anterior pelvic abscess drain: Also under sterile conditions and local anesthesia, CT guidance was utilized to advance an 18 gauge needle into the anterior pelvic fluid collection. Needle position confirmed with CT and aspiration yielded purulent fluid sent for culture. Twelve French drain inserted over a guidewire. Drain catheter position confirmed with CT. Catheter secured with a Prolene suture and connected to external suction bulb. Sterile dressing applied. No immediate complication FINDINGS: Imaging confirms drainage of the right lower quadrant and pelvic abscess. IMPRESSION: Successful CT-guided right lower quadrant and anterior pelvic abscess drains Electronically Signed   By: Judie Petit.  Shick M.D.   On: 02/23/2017 17:12   Ct Image Guided Fluid Drain By Catheter  Result Date: 02/23/2017 INDICATION: Right lower quadrant and anterior pelvic abscesses EXAM: CT GUIDED DRAINAGE OF  2 PELVIC ABSCESSES MEDICATIONS: The patient is BEING admitted to the hospital and receiving intravenous antibiotics. She currently is on oral Levaquin. ANESTHESIA/SEDATION: 3.0 mg IV Versed 150 mcg IV Fentanyl Moderate Sedation Time:  13 minutes The patient was continuously monitored during the procedure by the interventional radiology nurse under my direct supervision. COMPLICATIONS: None  immediate. TECHNIQUE: Informed written consent was obtained from the patient after a thorough discussion of the procedural risks, benefits and alternatives. All questions were addressed. Maximal Sterile Barrier Technique was utilized including caps, mask, sterile gowns, sterile gloves, sterile drape, hand hygiene and skin antiseptic. A timeout was performed prior to the initiation of  the procedure. PROCEDURE: Previous imaging reviewed. Patient positioned supine. Noncontrast localization CT performed. The right lower quadrant anterior pelvic abscess were localized. Overlying areas were marked. Right lower quadrant pelvic abscess drain: Under sterile conditions and local anesthesia, CT guidance was utilized to advance an 18 gauge 10 cm needle into the right lower quadrant abscess. Needle position confirmed with C T. Syringe aspiration yielded purulent fluid. Sample sent for culture. Guidewire inserted followed by tract dilatation to insert a 12 [place] drain. Drain catheter position confirmed with CT. Catheter secured with a Prolene suture and connected to external suction bulb. Sterile dressing applied. Anterior pelvic abscess drain: Also under sterile conditions and local anesthesia, CT guidance was utilized to advance an 18 gauge needle into the anterior pelvic fluid collection. Needle position confirmed with CT and aspiration yielded purulent fluid sent for culture. Twelve French drain inserted over a guidewire. Drain catheter position confirmed with CT. Catheter secured with a Prolene suture and connected to external suction bulb. Sterile dressing applied. No immediate complication FINDINGS: Imaging confirms drainage of the right lower quadrant and pelvic abscess. IMPRESSION: Successful CT-guided right lower quadrant and anterior pelvic abscess drains Electronically Signed   By: Judie Petit.  Shick M.D.   On: 02/23/2017 17:12   Ct Image Guided Drainage By Percutaneous Catheter  Result Date: 02/08/2017 CLINICAL DATA:  Right psoas abscess EXAM: CT GUIDED DRAINAGE OF PSOAS ABSCESS ANESTHESIA/SEDATION: Intravenous Fentanyl and Versed were administered as conscious sedation during continuous monitoring of the patient's level of consciousness and physiological / cardiorespiratory status by the radiology RN, with a total moderate sedation time of 14 minutes. PROCEDURE: The procedure, risks, benefits, and  alternatives were explained to the patient. Questions regarding the procedure were encouraged and answered. The patient understands and consents to the procedure. Patient placed prone. Select axial scans through the mid abdomen obtained. The collection was localized and an appropriate skin entry site was determined and marked. The operative field was prepped with chlorhexidinein a sterile fashion, and a sterile drape was applied covering the operative field. A sterile gown and sterile gloves were used for the procedure. Local anesthesia was provided with 1% Lidocaine. Under CT fluoroscopic guidance, a 19 gauge percutaneous entry needle was advanced into the collection. Green purulent thick material could be aspirated. An Amplatz guidewire advanced easily within the collection, its position confirmed on CT to facilitate placement of a 12 French pigtail catheter, formed centrally within the collection. CT confirms appropriate positioning. The catheter was secured externally with 0 Prolene suture and StatLock and placed to gravity drain bag. 20 mL purulent aspirate sent for Gram stain and culture. The patient tolerated the procedure well. COMPLICATIONS: None immediate FINDINGS: Loculated large right psoas collection was again localized. 12 French drain catheter placed as above. Sample of the aspirate sent for Gram stain and culture. IMPRESSION: 1. Technically successful CT-guided right psoas retroperitoneal abscess drain catheter placement. Electronically Signed   By: Corlis Leak M.D.   On: 02/08/2017 18:13   Ir Radiologist Eval & Mgmt  Result Date: 02/22/2017 Please refer to notes tab for details about interventional procedure. (Op Note)   Micro Results    Recent Results (from the past 240 hour(s))  Aerobic/Anaerobic Culture (surgical/deep wound)     Status: None (Preliminary result)   Collection Time: 02/23/17  4:31 PM  Result Value Ref Range Status   Specimen Description   Final    ABSCESS Performed  at [facility], [address], [place], [place] [address]    Special Requests   Final    Normal Performed at [facility], [address] [place], [place] [address]    Gram Stain   Final    ABUNDANT WBC PRESENT, PREDOMINANTLY PMN NO SQUAMOUS EPITHELIAL CELLS SEEN NO ORGANISMS SEEN    Culture   Final    NO GROWTH 1 DAY Performed at [facility], [address] [place], [place] [address]    Report Status PENDING  Incomplete  Aerobic/Anaerobic Culture (surgical/deep wound)     Status: None (Preliminary result)   Collection Time: 02/23/17  4:31 PM  Result Value Ref Range Status   Specimen Description   Final    ABDOMEN Performed at [facility], [address], [place], [place] [address]    Special Requests   Final    Normal Performed at [facility], [address] [place], [place] [address]    Gram Stain   Final    ABUNDANT WBC PRESENT, PREDOMINANTLY PMN NO SQUAMOUS EPITHELIAL CELLS SEEN NO ORGANISMS SEEN    Culture   Final    NO GROWTH 1 DAY Performed at [facility], [address], [place], [place] [address]    Report Status PENDING  Incomplete       Today   Subjective    Daylyn Igo today has no fever or chills. No nausea vomiting. She is feeling fine and would like to go home today. She has had instructions on how to care for her wounds/drain and if his comfortable with her husband this point.       Patient has been seen and examined prior to discharge   Objective   Blood pressure 120/89, pulse 95, temperature 98 F (36.7 C), temperature source Oral, resp. rate 15, height 5\' 1"  (1.549 m), weight 68 kg (150 lb), last menstrual period 08/16/2015, SpO2 98 %.   Intake/Output Summary (Last 24 hours) at 02/24/2017 1316 Last data filed at 02/24/2017 1005 Gross per 24 hour  Intake 150 ml  Output 196 ml  Net -46 ml    Exam Gen:- Awake  comfortable HEENT:-  [place].AT Neck-Supple Neck,No JVD,  Lungs- clear CV- S1, S2 normal Abd-  +ve B.Sounds, Abd Soft, No tenderness,    Extremity/Skin:-Drains noted, no evidence of surrounding skin infection, Intact peripheral pulses    Data Review   CBC w Diff:  Lab Results  Component Value Date   WBC 4.5 02/23/2017   HGB 9.1 (L) 02/23/2017   HCT 28.5 (L) 02/23/2017   PLT 271 02/23/2017   LYMPHOPCT 11 02/08/2017   MONOPCT 5 02/08/2017   EOSPCT 0 02/08/2017   BASOPCT 0 02/08/2017    CMP:  Lab Results  Component Value Date   NA 133 (L) 02/11/2017   K 3.3 (L) 02/11/2017   CL 102 02/11/2017   CO2 22 02/11/2017   BUN <5 (L) 02/11/2017   CREATININE 0.47 02/11/2017   PROT 7.1 02/07/2017   ALBUMIN 2.3 (L) 02/07/2017   BILITOT 0.4 02/07/2017   ALKPHOS 124 02/07/2017   AST 27 02/07/2017   ALT 27 02/07/2017  .   Total Discharge time is about 33 minutes  OSEI-BONSU,Lorean Ekstrand  M.D on 02/24/2017 at 1:16 PM  [facility]   Office  [phone]  Dragon dictation system was used to create this note, attempts have been made to correct errors, however presence of uncorrected errors is not a reflection quality of care provided

## 2017-02-24 NOTE — Progress Notes (Signed)
Supervising Physician: Simonne Come  Patient Status:  [facility] - In-pt  Chief Complaint: Osteomyelitis of lumbar spine Psoas abscess Pelvis abscess  Subjective: Patient is feeling well this AM s/p drain placement.  States she feels she can go home.   Allergies: Ceftriaxone  Medications: Prior to Admission medications   Medication Sig Start Date End Date Taking? Authorizing Provider  acetaminophen-codeine (TYLENOL #3) 300-30 MG tablet Take 1 tablet by mouth every 6 hours prn for pain   Yes [provider]  fluticasone (FLONASE) 50 MCG/ACT nasal spray Place 1 spray into both nostrils daily.   Yes [provider]  levofloxacin (LEVAQUIN) 500 MG tablet Take 1 tablet (500 mg total) by mouth daily. 02/21/17  Yes Cliffton Asters, MD  methocarbamol (ROBAXIN) 500 MG tablet TK 2 TS PO Q 6 TO 8 H PRN FOR INFLAMMATION AND PAIN 01/08/17  Yes [provider]  oxyCODONE-acetaminophen (PERCOCET) 7.5-325 MG tablet Take 1 tablet by mouth every 4 (four) hours as needed for severe pain. 02/12/17 02/12/18 Yes Amin, Loura Halt, MD  levofloxacin (LEVAQUIN) 500 MG tablet Take 1 tablet (500 mg total) by mouth daily. 02/24/17   Jackie Plum, MD  oxyCODONE-acetaminophen (PERCOCET) 7.5-325 MG tablet Take 1 tablet by mouth every 4 (four) hours as needed for moderate pain or severe pain. 02/24/17   Jackie Plum, MD  senna (SENOKOT) 8.6 MG TABS tablet Take 1 tablet (8.6 mg total) by mouth daily as needed for mild constipation. Patient not taking: Reported on 02/21/2017 02/12/17   Dimple Nanas, MD     Vital Signs: BP 128/76 (BP Location: Left Arm)   Pulse (!) 102   Temp 98 F (36.7 C) (Oral)   Resp 16   Ht 5\' 1"  (1.549 m)   Wt 150 lb (68 kg)   LMP 08/16/2015 (Approximate)   SpO2 99%   BMI 28.34 kg/m   Physical Exam  Constitutional: She is oriented to person, place, and time. She appears well-developed.  Cardiovascular: Normal rate.  Pulmonary/Chest: Effort normal.  No respiratory distress.  Abdominal: Soft. There is no tenderness.  2 lower quadrant drains in place with purulent, blood-tinged output.  Posterior drain in place with continued cloudy output. Sites intact.   Neurological: She is alert and oriented to person, place, and time.  Skin: Skin is warm and dry.  Nursing note and vitals reviewed.   Imaging: Ct Abdomen Pelvis W Contrast  Result Date: 02/22/2017 CLINICAL DATA:  History of right psoas abscess, post percutaneous drainage catheter placement on 02/08/2017. EXAM: CT ABDOMEN AND PELVIS WITH CONTRAST TECHNIQUE: Multidetector CT imaging of the abdomen and pelvis was performed using the standard protocol following bolus administration of intravenous contrast. CONTRAST:  ISOVUE-300 IOPAMIDOL (ISOVUE-300) INJECTION 61% COMPARISON:  Lumbar spine MRI - 01/31/2017; CT-guided right psoas abscess drainage catheter placement - 02/08/2017 FINDINGS: Lower chest: Limited visualization of lower thorax demonstrates minimal subsegmental atelectasis within the bilateral lung bases and bilateral costophrenic angles, left greater than right. No discrete focal airspace opacities. No pleural effusion. Normal heart size. No pericardial effusion. Tip of presumed PICC line terminates within the superior cavoatrial junction. Hepatobiliary: Normal hepatic contour. No discrete hepatic lesions. Normal appearance of the gallbladder given degree distention. No radiopaque gallstones. No intra extrahepatic bili duct dilatation. No ascites. Pancreas: Normal appearance of the pancreas Spleen: Borderline splenomegaly with the spleen measuring 12.1 cm in length (coronal image 60, series 3). Adrenals/Urinary Tract: There is symmetric enhancement of the bilateral kidneys. No definite renal stones this  postcontrast examination. No discrete renal lesions. No urinary obstruction or perinephric stranding. Normal appearance the bilateral adrenal glands. There is diffuse thickening the  urinary bladder wall, potentially accentuated due to underdistention. Stomach/Bowel: Scattered minimal colonic diverticulosis without evidence of diverticulitis. Normal appearance of the terminal ileum and retrocecal appendix. No pneumoperitoneum, pneumatosis or portal venous gas. Vascular/Lymphatic: Normal caliber of the abdominal aorta. The major branch vessels of the abdominal aorta appear patent on this non CTA examination. No bulky retroperitoneal, mesenteric, pelvic or inguinal lymphadenopathy. Reproductive: The uterus appears slightly diminutive. No discrete adnexal lesion. No free fluid in the pelvic cul-de-sac. Other: Note is made of a small mesenteric fat containing periumbilical hernia. There is a minimal amount of subcutaneous edema about the midline of the low back. Musculoskeletal: Unchanged positioning right flank percutaneous drainage catheter with near resolution of dominant component psoas abscess/fluid collection. Unfortunately, there is persistent peripherally enhancing fluid collection within the more caudal aspect of the right iliopsoas musculature with dominant component measuring approximately 2.7 x 1.7 cm (image 59, series 6) which may communicate with an additional larger collection within the soft tissues of the medial aspect of the proximal right femur measuring approximately 4.6 x 1.5 cm (image 84, series 2). There are serpiginous fluid collections within the ventral aspect of the right lower abdomen/pelvis measuring approximately 6.0 x 5.3 x 12.8 cm (axial image 59, series 2, coronal image 43, series 3) as well as a serpiginous fluid collection within the inferior aspect of the rectus abdominal musculature measuring approximately 6.9 x 4.2 x 5.0 cm (axial image 67, series 2, coronal image 37, series 3). The dominant midline rectus component appears to communicate with several additional serpiginous fluid collection within the ventral aspect of the lower pelvis (best seen on coronal  images 49 through 66, series 3) with dominant right-sided collection measuring approximately 4.1 x 1.4 cm (image 75, series 2) and dominant left-sided component measuring approximately 2.5 x 1.3 cm (image 76, series 2). IMPRESSION: 1. Unchanged positioning of right flank percutaneous drainage catheter with near complete resolution of dominant component of right-sided psoas abscess/fluid collection. 2. Persistent peripherally and hand seen abscesses within the caudal aspect of the right iliopsoas musculature with suspected serpiginous communication with an additional larger collection within medial aspect of the proximal right femur. 3. Serpiginous fluid collections within the right and midline of the ventral abdominal wall with suspected communication with several serpiginous fluid collections within the bilateral lower pelvis. Dominant fluid collection within the right lower abdomen measures approximately 12.8 cm while additional collection within the lower aspect of the rectus abdominal musculature measures approximately 6.9 cm. Above findings discussed with Dr. Orvan Falconerampbell at the time of image acquisition. PLAN: - Per my discussion with Dr. Orvan Falconerampbell, the patient will be scheduled [facility] CT[facility]guided percutaneous [facility] We will arrange for the patient to undergo at least a 23 hour observation/admission with the [facility]. Electronically Signed   By: Simonne ComeJohn  Watts M.D.   On: 02/22/2017 11:43   Ct Image Guided Fluid Drain By Catheter  Result Date: 02/23/2017 INDICATION: Right lower quadrant and anterior pelvic abscesses EXAM: CT GUIDED DRAINAGE OF  2 PELVIC ABSCESSES MEDICATIONS: The patient is BEING admitted to the hospital and receiving intravenous antibiotics. She currently is on oral Levaquin. ANESTHESIA/SEDATION: 3.0 mg IV Versed 150 mcg IV Fentanyl Moderate Sedation Time:  13 minutes The patient was continuously monitored during the procedure by the  interventional radiology nurse under my direct supervision. COMPLICATIONS: None immediate.  TECHNIQUE: Informed written consent was obtained from the patient after a thorough discussion of the procedural risks, benefits and alternatives. All questions were addressed. Maximal Sterile Barrier Technique was utilized including caps, mask, sterile gowns, sterile gloves, sterile drape, hand hygiene and skin antiseptic. A timeout was performed prior to the initiation of the procedure. PROCEDURE: Previous imaging reviewed. Patient positioned supine. Noncontrast localization CT performed. The right lower quadrant anterior pelvic abscess were localized. Overlying areas were marked. Right lower quadrant pelvic abscess drain: Under sterile conditions and local anesthesia, CT guidance was utilized to advance an 18 gauge 10 cm needle into the right lower quadrant abscess. Needle position confirmed with C T. Syringe aspiration yielded purulent fluid. Sample sent for culture. Guidewire inserted followed by tract dilatation to insert a 12 [place] drain. Drain catheter position confirmed with CT. Catheter secured with a Prolene suture and connected to external suction bulb. Sterile dressing applied. Anterior pelvic abscess drain: Also under sterile conditions and local anesthesia, CT guidance was utilized to advance an 18 gauge needle into the anterior pelvic fluid collection. Needle position confirmed with CT and aspiration yielded purulent fluid sent for culture. Twelve French drain inserted over a guidewire. Drain catheter position confirmed with CT. Catheter secured with a Prolene suture and connected to external suction bulb. Sterile dressing applied. No immediate complication FINDINGS: Imaging confirms drainage of the right lower quadrant and pelvic abscess. IMPRESSION: Successful CT-guided right lower quadrant and anterior pelvic abscess drains Electronically Signed   By: Judie Petit.  Shick M.D.   On: 02/23/2017 17:12   Ct Image Guided  Fluid Drain By Catheter  Result Date: 02/23/2017 INDICATION: Right lower quadrant and anterior pelvic abscesses EXAM: CT GUIDED DRAINAGE OF  2 PELVIC ABSCESSES MEDICATIONS: The patient is BEING admitted to the hospital and receiving intravenous antibiotics. She currently is on oral Levaquin. ANESTHESIA/SEDATION: 3.0 mg IV Versed 150 mcg IV Fentanyl Moderate Sedation Time:  13 minutes The patient was continuously monitored during the procedure by the interventional radiology nurse under my direct supervision. COMPLICATIONS: None immediate. TECHNIQUE: Informed written consent was obtained from the patient after a thorough discussion of the procedural risks, benefits and alternatives. All questions were addressed. Maximal Sterile Barrier Technique was utilized including caps, mask, sterile gowns, sterile gloves, sterile drape, hand hygiene and skin antiseptic. A timeout was performed prior to the initiation of the procedure. PROCEDURE: Previous imaging reviewed. Patient positioned supine. Noncontrast localization CT performed. The right lower quadrant anterior pelvic abscess were localized. Overlying areas were marked. Right lower quadrant pelvic abscess drain: Under sterile conditions and local anesthesia, CT guidance was utilized to advance an 18 gauge 10 cm needle into the right lower quadrant abscess. Needle position confirmed with C T. Syringe aspiration yielded purulent fluid. Sample sent for culture. Guidewire inserted followed by tract dilatation to insert a 12 [place] drain. Drain catheter position confirmed with CT. Catheter secured with a Prolene suture and connected to external suction bulb. Sterile dressing applied. Anterior pelvic abscess drain: Also under sterile conditions and local anesthesia, CT guidance was utilized to advance an 18 gauge needle into the anterior pelvic fluid collection. Needle position confirmed with CT and aspiration yielded purulent fluid sent for culture. Twelve French drain  inserted over a guidewire. Drain catheter position confirmed with CT. Catheter secured with a Prolene suture and connected to external suction bulb. Sterile dressing applied. No immediate complication FINDINGS: Imaging confirms drainage of the right lower quadrant and pelvic abscess. IMPRESSION: Successful CT-guided right lower quadrant and anterior pelvic  abscess drains Electronically Signed   By: Judie Petit.  Shick M.D.   On: 02/23/2017 17:12   Ir Radiologist Eval & Mgmt  Result Date: 02/22/2017 Please refer to notes tab for details about interventional procedure. (Op Note)   Labs:  CBC: Recent Labs    02/09/17 0607 02/10/17 0852 02/11/17 0658 02/23/17 1317  WBC 13.8* 15.0* 14.8* 4.5  HGB 8.1* 8.0* 8.1* 9.1*  HCT 25.3* 25.4* 25.3* 28.5*  PLT 638* 599* 587* 271    COAGS: Recent Labs    02/08/17 0841 02/23/17 1317  INR 1.24 0.97    BMP: Recent Labs    02/08/17 0616 02/09/17 0607 02/10/17 0852 02/11/17 0658  NA 138 137 135 133*  K 3.7 2.8* 3.2* 3.3*  CL 103 106 106 102  CO2 26 24 23 22   GLUCOSE 114* 112* 109* 107*  BUN 5* <5* <5* <5*  CALCIUM 8.0* 7.5* 7.6* 7.7*  CREATININE 0.50 0.52 0.48 0.47  GFRNONAA >60 >60 >60 >60  GFRAA >60 >60 >60 >60    LIVER FUNCTION TESTS: Recent Labs    02/07/17 1739  BILITOT 0.4  AST 27  ALT 27  ALKPHOS 124  PROT 7.1  ALBUMIN 2.3*    Assessment and Plan: Osteomyelitis, psoas muscle abscess, pelvic abscess s/p retroperitoneal drain placement 02/08/17, now s/p 2 additional lower quadrant abdominal drains placed 02/23/17.  Patient is stable and doing well after additional drains placed yesterday.  She is instructed on routine drain care.  She has been flushing her posterior drain regularly.  Outpatient follow-up orders have been placed.  She will hear from schedulers with date and time of appointment.   Electronically Signed: Hoyt Koch, PA 02/24/2017, 3:05 PM   I spent a total of 15 Minutes at the the patient's  bedside AND on the patient's hospital floor or unit, greater than 50% of which was counseling/coordinating care for pelvic abscess.

## 2017-02-26 ENCOUNTER — Other Ambulatory Visit: Payer: Self-pay | Admitting: Internal Medicine

## 2017-02-26 DIAGNOSIS — N739 Female pelvic inflammatory disease, unspecified: Secondary | ICD-10-CM

## 2017-02-28 LAB — AEROBIC/ANAEROBIC CULTURE W GRAM STAIN (SURGICAL/DEEP WOUND)
Special Requests: NORMAL
Special Requests: NORMAL

## 2017-03-05 ENCOUNTER — Encounter: Payer: Self-pay | Admitting: Internal Medicine

## 2017-03-07 ENCOUNTER — Ambulatory Visit
Admission: RE | Admit: 2017-03-07 | Discharge: 2017-03-07 | Disposition: A | Discharge status: Home / Self Care | Payer: No Typology Code available for payment source | Source: Ambulatory Visit | Attending: Student | Admitting: Student

## 2017-03-07 ENCOUNTER — Encounter: Payer: Self-pay | Admitting: Radiology

## 2017-03-07 ENCOUNTER — Ambulatory Visit
Admission: RE | Admit: 2017-03-07 | Discharge: 2017-03-07 | Disposition: A | Discharge status: Home / Self Care | Payer: No Typology Code available for payment source | Source: Ambulatory Visit | Attending: Internal Medicine | Admitting: Internal Medicine

## 2017-03-07 ENCOUNTER — Ambulatory Visit (INDEPENDENT_AMBULATORY_CARE_PROVIDER_SITE_OTHER): Payer: No Typology Code available for payment source | Admitting: Internal Medicine

## 2017-03-07 DIAGNOSIS — K6812 Psoas muscle abscess: Secondary | ICD-10-CM

## 2017-03-07 DIAGNOSIS — M4626 Osteomyelitis of vertebra, lumbar region: Secondary | ICD-10-CM

## 2017-03-07 DIAGNOSIS — N739 Female pelvic inflammatory disease, unspecified: Secondary | ICD-10-CM

## 2017-03-07 DIAGNOSIS — T7840XA Allergy, unspecified, initial encounter: Secondary | ICD-10-CM | POA: Insufficient documentation

## 2017-03-07 DIAGNOSIS — T7840XD Allergy, unspecified, subsequent encounter: Secondary | ICD-10-CM | POA: Diagnosis not present

## 2017-03-07 HISTORY — PX: IR RADIOLOGIST EVAL & MGMT: IMG5224

## 2017-03-07 MED ORDER — IOPAMIDOL (ISOVUE-300) INJECTION 61%
100.0000 mL | Freq: Once | INTRAVENOUS | Status: AC | PRN
  Administered 2017-03-07: 100 mL via INTRAVENOUS

## 2017-03-07 NOTE — Assessment & Plan Note (Signed)
Her pelvic abscess is markedly improved with recent catheter drainage.

## 2017-03-07 NOTE — Assessment & Plan Note (Signed)
Her psoas abscess is markedly improved with 4 weeks of antibiotics and recent percutaneous drainage.  Her catheter has been removed.  She still has some evidence of radiculopathy with pain tingling and weakness in her right leg but this is improving slowly.  She may benefit from physical therapy.

## 2017-03-07 NOTE — Progress Notes (Signed)
[facility]  Patient Active Problem List   Diagnosis Date Noted  . Allergic reaction caused by a drug 03/07/2017  . Pelvic abscess in female 02/23/2017  . 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 Psoas abscess, right (HCC) 02/07/2017    Patient's Medications  New Prescriptions   No medications on file  Previous Medications   LEVOFLOXACIN (LEVAQUIN) 500 MG TABLET    Take 1 tablet (500 mg total) by mouth daily.   METHOCARBAMOL (ROBAXIN) 500 MG TABLET    TK 2 TS PO Q 6 TO 8 H PRN FOR INFLAMMATION AND PAIN   OXYCODONE-ACETAMINOPHEN (PERCOCET) 7.5-325 MG TABLET    Take 1 tablet by mouth every 4 (four) hours as needed for moderate pain or severe pain.   SENNA (SENOKOT) 8.6 MG TABS TABLET    Take 1 tablet (8.6 mg total) by mouth daily as needed for mild constipation.  Modified Medications   No medications on file  Discontinued Medications   No medications on file    Subjective: Joy Morris is in with her husband for her routine follow-up visit.  She is now completed 28 days of total antibiotic therapy, including 15 days of levofloxacin most recently for her severe lumbar osteomyelitis complicated by right psoas, pelvic and abdominal wall abscesses.  She is tolerating levofloxacin well.  She is feeling much better.  Her back and right leg pain has improved.  She has not required any narcotic pain medication in several days.  She is taking acetaminophen as needed.  She is hoping to go back to work soon.  She is still having some numbness, pain and weakness in her right leg but this is slowly improving.  She no longer uses her cane when walking around at home.  Her abscess drains have had very little output recently.  She underwent repeat CT scan this morning and all abscesses had improved significantly.  Her drains were removed.  Review of Systems: Review of Systems  Constitutional: Negative for chills, diaphoresis and fever.  Respiratory: Negative for  cough, sputum production and shortness of breath.   Cardiovascular: Negative for chest pain.  Gastrointestinal: Negative for abdominal pain, nausea and vomiting.  Musculoskeletal: Positive for back pain.  Skin: Negative for itching and rash.  Neurological: Positive for sensory change and focal weakness.    Past Medical History:  Diagnosis Date  . 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 Smoking status: Never Smoker  . Smokeless tobacco: Never Used  Substance Use Topics  . Alcohol use: No    Frequency: Never  . Drug use: No    Family History  Problem Relation Age of Onset  . Hypertension Mother   . Hyperlipidemia Mother   . Leukemia Father        CML    Allergies  Allergen Reactions  . Ceftriaxone Itching and Rash    Fever, soft stool    Objective: Vitals:   03/07/17 1515  BP: (!) 134/91  Pulse: 94  Temp: (!) 97.5 F (36.4 C)  TempSrc: Oral  Weight: 145 lb (65.8 kg)  Height: 5\' 1"  (1.549 m)   Body mass index is 27.4 kg/m.  Physical Exam  Constitutional: She is oriented to person, place, and time.  She is smiling and in good spirits.  HENT:  Mouth/Throat: No oropharyngeal exudate.  Abdominal: Soft. She exhibits no distension. There is no tenderness.  Neurological:  She is alert and oriented to person, place, and time.  Skin: No rash noted.  Psychiatric: Mood and affect normal.   Labs 02/13/2017 Sedimentation rate high at 100 C-reactive protein high at 60.8  CT abdomen and pelvis with contrast 03/07/2017  IMPRESSION: 1. Resolved right lower quadrant and left rectus sheath body wall abscesses following percutaneous drainage catheter placement. 2. Continued reduction/near resolution right iliopsoas and proximal medial right thigh fluid collections following percutaneous drainage catheter placement. 3. Grossly unchanged endplate lucencies about the L2-L3 intervertebral disc space at the location previously  suspected discitis/osteomyelitis. 4. No new definable/drainable fluid collections within the abdomen or pelvis.  PLAN: - Given proximity of the right lower quadrant percutaneous drainage catheter the ventral aspect of the cecum, this drainage catheter subsequently underwent percutaneous drainage catheter injection (which fortunately was negative for fistulous connection).  - Given lack of any output from any of the drainage catheters as well as near complete of all fluid collections, all percutaneous drainage catheters were subsequently removed at the patient's bedside.    By: Simonne Come M.D.   On: 03/07/2017 15:06    Problem List Items Addressed This Visit      Unprioritized   Allergic reaction caused by a drug    Her recent fever and rash will has an allergic reaction to ceftriaxone.  It has resolved after changing to levofloxacin.      Osteomyelitis of lumbar spine (HCC)    She has a smoldering osteomyelitis at the L2-3 level with bony destruction.  She has improved but will need a bare minimum of 6 weeks of total antibiotic therapy.  I will repeat her inflammatory markers and see her back next week.      Relevant Orders   C-reactive protein   Sedimentation rate   CBC   Basic metabolic panel   Pelvic abscess in female    Her pelvic abscess is markedly improved with recent catheter drainage.      Psoas abscess, right (HCC)    Her psoas abscess is markedly improved with 4 weeks of antibiotics and recent percutaneous drainage.  Her catheter has been removed.  She still has some evidence of radiculopathy with pain tingling and weakness in her right leg but this is improving slowly.  She may benefit from physical therapy.          Cliffton Asters, MD [facility] [phone] pager   [phone] cell 03/07/2017, 5:27 PM

## 2017-03-07 NOTE — Assessment & Plan Note (Signed)
Her recent fever and rash will has an allergic reaction to ceftriaxone.  It has resolved after changing to levofloxacin.

## 2017-03-07 NOTE — Progress Notes (Signed)
Patient ID: Joy Morris, female   DOB: [birth year], 42 y.o.   MRN: [id number]         Chief Complaint: Percutaneous drainage catheter evaluation and management  Referring Physician(s): Orvan Falconer (ID)  History of Present Illness: Joy Morris is a 42 y.o. 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  Patient initially presented to the interventional radiology drain clinic on 02/23/2017 with abdominal CT demonstrating 2 additional fluid collections within the ventral aspect of the right lower/pelvic abdominal wall as well as the caudal aspect of the left rectus sheath for which she went placement underwent placement of 2 additional percutaneous drainage catheters on 02/23/2017.  She returns today to the interventional radiology drain clinic for drainage catheter evaluation and management.  Fortunately, the patient reports little to no output from any of the percutaneous drainage catheters for the past several days.  Patient continues to take her prescribed course of antibiotics. She denies fever or chills. Patient states her energy level is much improved and she is starting to feel more like herself.  Past Medical History:  Diagnosis Date  . 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 IR RADIOLOGIST EVAL & MGMT  02/22/2017  . IR RADIOLOGIST EVAL & MGMT  03/07/2017    Allergies: Ceftriaxone  Medications: Prior to Admission medications   Medication Sig Start Date End Date Taking? Authorizing Provider  levofloxacin (LEVAQUIN) 500 MG tablet Take 1 tablet (500 mg total) by mouth daily. 02/24/17   Jackie Plum, MD  methocarbamol (ROBAXIN) 500 MG tablet TK 2 TS PO Q 6 TO 8 H PRN FOR  INFLAMMATION AND PAIN 01/08/17   [provider]  oxyCODONE-acetaminophen (PERCOCET) 7.5-325 MG tablet Take 1 tablet by mouth every 4 (four) hours as needed for moderate pain or severe pain. 02/24/17   Jackie Plum, MD  senna (SENOKOT) 8.6 MG TABS tablet Take 1 tablet (8.6 mg total) by mouth daily as needed for mild constipation. Patient not taking: Reported on 03/07/2017 02/12/17   Dimple Nanas, MD     Family History  Problem Relation Age of Onset  . Hypertension Mother   . Hyperlipidemia Mother   . Leukemia Father        CML    Social History   Socioeconomic History  . Marital status: Married    Spouse name: Not on file  . Number of children: Not on file  . Years of education: Not on file  . Highest education level: Not on file  Social Needs  . Financial resource strain: Not on file  . Food insecurity - worry: Not on file  . Food insecurity - inability: Not on file  . Transportation needs - medical: Not on file  . Transportation needs - non-medical: Not on file  Occupational History  . Not on file  Tobacco Use  . Smoking status: Never Smoker  . Smokeless tobacco: Never Used  Substance and Sexual Activity  . Alcohol use: No    Frequency: Never  . Drug use: No  . Sexual activity: Not on file  Other Topics Concern  . Not on file  Social History Narrative  . Not on file    ECOG Status: 1 - Symptomatic but completely ambulatory  Review of Systems: A  12 point ROS discussed and pertinent positives are indicated in the HPI above.  All other systems are negative.  Review of Systems  Constitutional: Negative for activity change, fatigue and fever.  Gastrointestinal: Negative.     Vital Signs: LMP 08/16/2015 (Approximate)   Physical Exam  Mallampati Score:     Imaging: Ct Abdomen Pelvis W Contrast  Result Date: 03/07/2017 CLINICAL DATA:  History of right psoas abscess, post percutaneous drainage catheter placement on 02/08/2017 The patient  initially presented to the Interventional Radiology drain Clinic on 02/22/2017 with abdominal CT demonstrating 2 additional undrained abscesses within the ventral wall of the right lower abdominal wall as well as the caudal aspect of the left rectus sheath for which she underwent technically successful percutaneous drainage catheter placement at both of these locations on 02/22/2017. Patient returns today to the interventional radiology drain Clinic for drainage catheter evaluation and management. The patient reports little (under 5 cc) to no output from any of the percutaneous drainage catheters for the past several days. The patient continues to flush although percutaneous drainage catheters. Patient continues to take her prescribed course of antibiotics. She denies fever or chills. EXAM: CT ABDOMEN AND PELVIS WITH CONTRAST TECHNIQUE: Multidetector CT imaging of the abdomen and pelvis was performed using the standard protocol following bolus administration of intravenous contrast. CONTRAST:  ISOVUE-300 IOPAMIDOL (ISOVUE-300) INJECTION 61% COMPARISON:  CT scan abdomen and pelvis - 02/22/2017; CT-guided right psoas abscess drainage catheter placement - 02/08/2017; CT-guided right lower quadrant and left rectus sheath percutaneous drainage catheter placement - 02/23/2017; lumbar spine MRI - 01/26/2017; 01/31/2017 FINDINGS: Lower chest: Limited visualization of lower thorax demonstrates minimal ground-glass atelectasis within the inferior segment of the lingula. No focal airspace opacities. No pleural effusion. Normal heart size.  No pericardial effusion. Hepatobiliary: Normal hepatic contour. There is a minimal amount of focal fatty infiltration adjacent to the fissure for the ligamentum teres. No discrete hepatic lesions. Appearance of the gallbladder given degree distention. No radiopaque gallstones. No intra extrahepatic biliary duct dilatation. No ascites. Pancreas: Normal appearance of the pancreas Spleen:  Normal appearance of the spleen Adrenals/Urinary Tract: There is symmetric enhancement and excretion of the bilateral kidneys. No definite renal stones on this postcontrast examination. No discrete renal lesions. No urine obstruction or perinephric stranding. Normal appearance of the bilateral adrenal glands. Normal appearance of the urinary bladder given degree distention. Stomach/Bowel: The bowel is normal in course and caliber without wall thickening or evidence of enteric obstruction. Normal appearance of the terminal ileum and retrocecal appendix. The right lower quadrant percutaneous drainage catheter abuts the ventral aspect of the cecum though on the coronal images appears extraperitoneal. Small hiatal hernia. No pneumoperitoneum, pneumatosis or portal venous gas No new definable/drainable intra-abdominal fluid collections. Vascular/Lymphatic: Normal caliber the abdominal aorta. The major branch vessels of the abdominal aorta appear widely patent on this non CTA examination. No bulky retroperitoneal, mesenteric, pelvic or inguinal lymphadenopathy. Reproductive: The uterus appears slightly atrophic. No discrete adnexal lesion. No free fluid within the pelvic cul-de-sac. Other: Interval resolution of right lower quadrant ventral wall fluid collection as well as fluid collection within the left rectus sheath following percutaneous drainage catheter placement. Unchanged positioning of right iliopsoas drainage catheter with continued reduction of ill-defined serpiginous fluid collection within the right iliopsoas with dominant component now measuring approximately 1.4 x 1.5 cm (image 54, series 3), previously, 2.7 x 1.7 cm. Additional fluid collection within the medial aspect of the thigh has also significantly reduced in size the interval,  currently measuring 3.0 x 1.0 cm (image 79, series 3), previously, 4.6 x 1.5 cm. Musculoskeletal: Grossly unchanged endplate lucencies again seen about the L2-L3  intervertebral disc space at location of suspected discitis/osteomyelitis seen on lumbar spine MRI performed 01/31/2017. Schmorl's node again seen involving the superior endplate T11. IMPRESSION: 1. Resolved right lower quadrant and left rectus sheath body wall abscesses following percutaneous drainage catheter placement. 2. Continued reduction/near resolution right iliopsoas and proximal medial right thigh fluid collections following percutaneous drainage catheter placement. 3. Grossly unchanged endplate lucencies about the L2-L3 intervertebral disc space at the location previously suspected discitis/osteomyelitis. 4. No new definable/drainable fluid collections within the abdomen or pelvis. PLAN: - Given proximity of the right lower quadrant percutaneous drainage catheter the ventral aspect of the cecum, this drainage catheter subsequently underwent percutaneous drainage catheter injection (which fortunately was negative for fistulous connection). - Given lack of any output from any of the drainage catheters as well as near complete of all fluid collections, all percutaneous drainage catheters were subsequently removed at the patient's bedside. Electronically Signed   By: Simonne Come M.D.   On: 03/07/2017 15:06   Ct Abdomen Pelvis W Contrast  Result Date: 02/22/2017 CLINICAL DATA:  History of right psoas abscess, post percutaneous drainage catheter placement on 02/08/2017. EXAM: CT ABDOMEN AND PELVIS WITH CONTRAST TECHNIQUE: Multidetector CT imaging of the abdomen and pelvis was performed using the standard protocol following bolus administration of intravenous contrast. CONTRAST:  ISOVUE-300 IOPAMIDOL (ISOVUE-300) INJECTION 61% COMPARISON:  Lumbar spine MRI - 01/31/2017; CT-guided right psoas abscess drainage catheter placement - 02/08/2017 FINDINGS: Lower chest: Limited visualization of lower thorax demonstrates minimal subsegmental atelectasis within the bilateral lung bases and bilateral costophrenic  angles, left greater than right. No discrete focal airspace opacities. No pleural effusion. Normal heart size. No pericardial effusion. Tip of presumed PICC line terminates within the superior cavoatrial junction. Hepatobiliary: Normal hepatic contour. No discrete hepatic lesions. Normal appearance of the gallbladder given degree distention. No radiopaque gallstones. No intra extrahepatic bili duct dilatation. No ascites. Pancreas: Normal appearance of the pancreas Spleen: Borderline splenomegaly with the spleen measuring 12.1 cm in length (coronal image 60, series 3). Adrenals/Urinary Tract: There is symmetric enhancement of the bilateral kidneys. No definite renal stones this postcontrast examination. No discrete renal lesions. No urinary obstruction or perinephric stranding. Normal appearance the bilateral adrenal glands. There is diffuse thickening the urinary bladder wall, potentially accentuated due to underdistention. Stomach/Bowel: Scattered minimal colonic diverticulosis without evidence of diverticulitis. Normal appearance of the terminal ileum and retrocecal appendix. No pneumoperitoneum, pneumatosis or portal venous gas. Vascular/Lymphatic: Normal caliber of the abdominal aorta. The major branch vessels of the abdominal aorta appear patent on this non CTA examination. No bulky retroperitoneal, mesenteric, pelvic or inguinal lymphadenopathy. Reproductive: The uterus appears slightly diminutive. No discrete adnexal lesion. No free fluid in the pelvic cul-de-sac. Other: Note is made of a small mesenteric fat containing periumbilical hernia. There is a minimal amount of subcutaneous edema about the midline of the low back. Musculoskeletal: Unchanged positioning right flank percutaneous drainage catheter with near resolution of dominant component psoas abscess/fluid collection. Unfortunately, there is persistent peripherally enhancing fluid collection within the more caudal aspect of the right iliopsoas  musculature with dominant component measuring approximately 2.7 x 1.7 cm (image 59, series 6) which may communicate with an additional larger collection within the soft tissues of the medial aspect of the proximal right femur measuring approximately 4.6 x 1.5 cm (image 84, series 2). There are serpiginous  fluid collections within the ventral aspect of the right lower abdomen/pelvis measuring approximately 6.0 x 5.3 x 12.8 cm (axial image 59, series 2, coronal image 43, series 3) as well as a serpiginous fluid collection within the inferior aspect of the rectus abdominal musculature measuring approximately 6.9 x 4.2 x 5.0 cm (axial image 67, series 2, coronal image 37, series 3). The dominant midline rectus component appears to communicate with several additional serpiginous fluid collection within the ventral aspect of the lower pelvis (best seen on coronal images 49 through 66, series 3) with dominant right-sided collection measuring approximately 4.1 x 1.4 cm (image 75, series 2) and dominant left-sided component measuring approximately 2.5 x 1.3 cm (image 76, series 2). IMPRESSION: 1. Unchanged positioning of right flank percutaneous drainage catheter with near complete resolution of dominant component of right-sided psoas abscess/fluid collection. 2. Persistent peripherally and hand seen abscesses within the caudal aspect of the right iliopsoas musculature with suspected serpiginous communication with an additional larger collection within medial aspect of the proximal right femur. 3. Serpiginous fluid collections within the right and midline of the ventral abdominal wall with suspected communication with several serpiginous fluid collections within the bilateral lower pelvis. Dominant fluid collection within the right lower abdomen measures approximately 12.8 cm while additional collection within the lower aspect of the rectus abdominal musculature measures approximately 6.9 cm. Above findings discussed with  Dr. Orvan Falconer at the time of image acquisition. PLAN: - Per my discussion with Dr. Orvan Falconer, the patient will be scheduled for CT-guided percutaneous drainage catheter placement tomorrow afternoon at [facility]. We will arrange for the patient to undergo at least a 23 hour observation/admission with the [facility]. Electronically Signed   By: Simonne Come M.D.   On: 02/22/2017 11:43   Dg Sinus/fist Tube Chk-non Gi  Result Date: 03/07/2017 CLINICAL DATA:  History of right iliopsoas and right ventral lower abdominal wall and left rectus sheath abscesses post percutaneous drainage catheter placement CT scan the abdomen pelvis performed earlier today demonstrates near complete resolution of all drained abscesses. Additionally, patient reports little to no output from any of the drainage catheters for the past several days. Given proximity of the right lower quadrant percutaneous drainage catheter to the ventral aspect of the cecum, the decision was made to proceed with drainage catheter injection prior to potential removal. EXAM: ABSCESS INJECTION COMPARISON:  CT abdomen pelvis - earlier same day; 02/22/2017; CONTRAST:  5 cc Omnipaque 300 FLUOROSCOPY TIME:  18 seconds (6 mGy) TECHNIQUE: The patient was positioned supine on the fluoroscopy table. A preprocedural spot fluoroscopic image was obtained of the right lower quadrant and existing percutaneous drainage catheter Multiple spot fluoroscopic and radiographic images were obtained following the injection of a small amount of contrast via the existing percutaneous drainage catheter. Images reviewed in the procedure was terminated. All drainage catheters removed at the patient's bedside without incident. Dressings were placed. The patient tolerated the procedure well without immediate postprocedural complication. FINDINGS: Preprocedural spot fluoroscopic image demonstrates unchanged positioning of right lower quadrant percutaneous drainage catheter with end  coiled and locked over the right lower abdomen/pelvis Additional percutaneous drainage catheter overlies the midline of the lower abdomen/pelvis. Excreted contrast is seen within the urinary bladder Contrast injection of the right lower quadrant drainage catheter demonstrates opacification of the right lower abdominal/pelvic ventral wall without fistulous connection. IMPRESSION: No evidence of communication between the right lower ventral wall drainage catheter and the peritoneal cavity. PLAN: - All percutaneous drainage catheters were removed without incident. -  The patient was instructed to maintain her follow-up appointment with Dr. Orvan Falconerampbell (infectious disease) scheduled for later today. Electronically Signed   By: Simonne ComeJohn  Kalyiah Saintil M.D.   On: 03/07/2017 15:47   Ct Image Guided Fluid Drain By Catheter  Result Date: 02/23/2017 INDICATION: Right lower quadrant and anterior pelvic abscesses EXAM: CT GUIDED DRAINAGE OF  2 PELVIC ABSCESSES MEDICATIONS: The patient is BEING admitted to the hospital and receiving intravenous antibiotics. She currently is on oral Levaquin. ANESTHESIA/SEDATION: 3.0 mg IV Versed 150 mcg IV Fentanyl Moderate Sedation Time:  13 minutes The patient was continuously monitored during the procedure by the interventional radiology nurse under my direct supervision. COMPLICATIONS: None immediate. TECHNIQUE: Informed written consent was obtained from the patient after a thorough discussion of the procedural risks, benefits and alternatives. All questions were addressed. Maximal Sterile Barrier Technique was utilized including caps, mask, sterile gowns, sterile gloves, sterile drape, hand hygiene and skin antiseptic. A timeout was performed prior to the initiation of the procedure. PROCEDURE: Previous imaging reviewed. Patient positioned supine. Noncontrast localization CT performed. The right lower quadrant anterior pelvic abscess were localized. Overlying areas were marked. Right lower quadrant  pelvic abscess drain: Under sterile conditions and local anesthesia, CT guidance was utilized to advance an 18 gauge 10 cm needle into the right lower quadrant abscess. Needle position confirmed with C T. Syringe aspiration yielded purulent fluid. Sample sent for culture. Guidewire inserted followed by tract dilatation to insert a 12 [place] drain. Drain catheter position confirmed with CT. Catheter secured with a Prolene suture and connected to external suction bulb. Sterile dressing applied. Anterior pelvic abscess drain: Also under sterile conditions and local anesthesia, CT guidance was utilized to advance an 18 gauge needle into the anterior pelvic fluid collection. Needle position confirmed with CT and aspiration yielded purulent fluid sent for culture. Twelve French drain inserted over a guidewire. Drain catheter position confirmed with CT. Catheter secured with a Prolene suture and connected to external suction bulb. Sterile dressing applied. No immediate complication FINDINGS: Imaging confirms drainage of the right lower quadrant and pelvic abscess. IMPRESSION: Successful CT-guided right lower quadrant and anterior pelvic abscess drains Electronically Signed   By: Judie PetitM.  Shick M.D.   On: 02/23/2017 17:12   Ct Image Guided Fluid Drain By Catheter  Result Date: 02/23/2017 INDICATION: Right lower quadrant and anterior pelvic abscesses EXAM: CT GUIDED DRAINAGE OF  2 PELVIC ABSCESSES MEDICATIONS: The patient is BEING admitted to the hospital and receiving intravenous antibiotics. She currently is on oral Levaquin. ANESTHESIA/SEDATION: 3.0 mg IV Versed 150 mcg IV Fentanyl Moderate Sedation Time:  13 minutes The patient was continuously monitored during the procedure by the interventional radiology nurse under my direct supervision. COMPLICATIONS: None immediate. TECHNIQUE: Informed written consent was obtained from the patient after a thorough discussion of the procedural risks, benefits and alternatives. All  questions were addressed. Maximal Sterile Barrier Technique was utilized including caps, mask, sterile gowns, sterile gloves, sterile drape, hand hygiene and skin antiseptic. A timeout was performed prior to the initiation of the procedure. PROCEDURE: Previous imaging reviewed. Patient positioned supine. Noncontrast localization CT performed. The right lower quadrant anterior pelvic abscess were localized. Overlying areas were marked. Right lower quadrant pelvic abscess drain: Under sterile conditions and local anesthesia, CT guidance was utilized to advance an 18 gauge 10 cm needle into the right lower quadrant abscess. Needle position confirmed with C T. Syringe aspiration yielded purulent fluid. Sample sent for culture. Guidewire inserted followed by tract dilatation to insert a  12 [place] drain. Drain catheter position confirmed with CT. Catheter secured with a Prolene suture and connected to external suction bulb. Sterile dressing applied. Anterior pelvic abscess drain: Also under sterile conditions and local anesthesia, CT guidance was utilized to advance an 18 gauge needle into the anterior pelvic fluid collection. Needle position confirmed with CT and aspiration yielded purulent fluid sent for culture. Twelve French drain inserted over a guidewire. Drain catheter position confirmed with CT. Catheter secured with a Prolene suture and connected to external suction bulb. Sterile dressing applied. No immediate complication FINDINGS: Imaging confirms drainage of the right lower quadrant and pelvic abscess. IMPRESSION: Successful CT-guided right lower quadrant and anterior pelvic abscess drains Electronically Signed   By: Judie Petit.  Shick M.D.   On: 02/23/2017 17:12   Ct Image Guided Drainage By Percutaneous Catheter  Result Date: 02/08/2017 CLINICAL DATA:  Right psoas abscess EXAM: CT GUIDED DRAINAGE OF PSOAS ABSCESS ANESTHESIA/SEDATION: Intravenous Fentanyl and Versed were administered as conscious sedation during  continuous monitoring of the patient's level of consciousness and physiological / cardiorespiratory status by the radiology RN, with a total moderate sedation time of 14 minutes. PROCEDURE: The procedure, risks, benefits, and alternatives were explained to the patient. Questions regarding the procedure were encouraged and answered. The patient understands and consents to the procedure. Patient placed prone. Select axial scans through the mid abdomen obtained. The collection was localized and an appropriate skin entry site was determined and marked. The operative field was prepped with chlorhexidinein a sterile fashion, and a sterile drape was applied covering the operative field. A sterile gown and sterile gloves were used for the procedure. Local anesthesia was provided with 1% Lidocaine. Under CT fluoroscopic guidance, a 19 gauge percutaneous entry needle was advanced into the collection. Green purulent thick material could be aspirated. An Amplatz guidewire advanced easily within the collection, its position confirmed on CT to facilitate placement of a 12 French pigtail catheter, formed centrally within the collection. CT confirms appropriate positioning. The catheter was secured externally with 0 Prolene suture and StatLock and placed to gravity drain bag. 20 mL purulent aspirate sent for Gram stain and culture. The patient tolerated the procedure well. COMPLICATIONS: None immediate FINDINGS: Loculated large right psoas collection was again localized. 12 French drain catheter placed as above. Sample of the aspirate sent for Gram stain and culture. IMPRESSION: 1. Technically successful CT-guided right psoas retroperitoneal abscess drain catheter placement. Electronically Signed   By: Corlis Leak M.D.   On: 02/08/2017 18:13   Ir Radiologist Eval & Mgmt  Result Date: 03/07/2017 Please refer to notes tab for details about interventional procedure. (Op Note)  Ir Radiologist Eval & Mgmt  Result Date:  02/22/2017 Please refer to notes tab for details about interventional procedure. (Op Note)   Labs:  CBC: Recent Labs    02/09/17 0607 02/10/17 0852 02/11/17 0658 02/23/17 1317  WBC 13.8* 15.0* 14.8* 4.5  HGB 8.1* 8.0* 8.1* 9.1*  HCT 25.3* 25.4* 25.3* 28.5*  PLT 638* 599* 587* 271    COAGS: Recent Labs    02/08/17 0841 02/23/17 1317  INR 1.24 0.97    BMP: Recent Labs    02/08/17 0616 02/09/17 0607 02/10/17 0852 02/11/17 0658  NA 138 137 135 133*  K 3.7 2.8* 3.2* 3.3*  CL 103 106 106 102  CO2 26 24 23 22   GLUCOSE 114* 112* 109* 107*  BUN 5* <5* <5* <5*  CALCIUM 8.0* 7.5* 7.6* 7.7*  CREATININE 0.50 0.52 0.48 0.47  GFRNONAA >  60 >60 >60 >60  GFRAA >60 >60 >60 >60    LIVER FUNCTION TESTS: Recent Labs    02/07/17 1739  BILITOT 0.4  AST 27  ALT 27  ALKPHOS 124  PROT 7.1  ALBUMIN 2.3*    TUMOR MARKERS: No results for input(s): AFPTM, CEA, CA199, CHROMGRNA in the last 8760 hours.  Assessment and Plan: Joy Morris is a 42 y.o. 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  Patient initially presented to the interventional radiology drain clinic on 02/23/2017 with abdominal CT demonstrating 2 additional fluid collections within the ventral aspect of the right lower/pelvic abdominal wall as well as the caudal aspect of the left rectus sheath for which she went placement underwent placement of 2 additional percutaneous drainage catheters on 02/23/2017.  She returns today to the interventional radiology drain clinic for drainage catheter evaluation and management.  Fortunately, the patient reports little to no output from any of the percutaneous drainage catheters for the past several days.  CT scan of the  abdomen and pelvis performed today demonstrates complete resolution of both ventral wall abdominal abscesses as well as continued reduction in size of tiny residual fluid collection within the right iliopsoas muscle which are extending to involve the medial aspect of the right upper thigh. Subsequent drainage catheter injection of the right lower quadrant drainage catheter was negative for evidence of fistulous communication to the peritoneal space.    Given lack of any output of any of the percutaneous drainage catheters for the past several days as well as near-complete resolution on imaging, all percutaneous drainage catheter is removed at the patient's bedside without incident.  Patient was encouraged to keep her follow-up a point with Dr. Orvan Falconer scheduled for later today and may otherwise return to the interventional radiology drain clinic on a when necessary basis.  A copy of this report was sent to the requesting provider on this date.  Electronically Signed: Simonne Come 03/07/2017, 4:48 PM   I spent a total of 10 Minutes in face to face in clinical consultation, greater than 50% of which was counseling/coordinating care for percutaneous drainage catheter evaluation and management.

## 2017-03-07 NOTE — Assessment & Plan Note (Signed)
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  She has improved but will need a bare minimum of 6 weeks of total antibiotic therapy.  I will repeat her inflammatory markers and see her back next week.

## 2017-03-08 LAB — CBC
HCT: 36.8 % (ref 35.0–45.0)
Hemoglobin: 12.3 g/dL (ref 11.7–15.5)
MCH: 27.3 pg (ref 27.0–33.0)
MCHC: 33.4 g/dL (ref 32.0–36.0)
MCV: 81.8 fL (ref 80.0–100.0)
MPV: 9.5 fL (ref 7.5–12.5)
PLATELETS: 550 10*3/uL — AB (ref 140–400)
RBC: 4.5 10*6/uL (ref 3.80–5.10)
RDW: 15.9 % — AB (ref 11.0–15.0)
WBC: 9.2 10*3/uL (ref 3.8–10.8)

## 2017-03-08 LAB — BASIC METABOLIC PANEL
BUN: 13 mg/dL (ref 7–25)
CO2: 25 mmol/L (ref 20–32)
CREATININE: 0.72 mg/dL (ref 0.50–1.10)
Calcium: 10.3 mg/dL — ABNORMAL HIGH (ref 8.6–10.2)
Chloride: 100 mmol/L (ref 98–110)
GLUCOSE: 107 mg/dL — AB (ref 65–99)
Potassium: 4.9 mmol/L (ref 3.5–5.3)
Sodium: 137 mmol/L (ref 135–146)

## 2017-03-08 LAB — SEDIMENTATION RATE: SED RATE: 50 mm/h — AB (ref 0–20)

## 2017-03-08 LAB — C-REACTIVE PROTEIN: CRP: 4 mg/L (ref <8.0)

## 2017-03-15 ENCOUNTER — Ambulatory Visit (INDEPENDENT_AMBULATORY_CARE_PROVIDER_SITE_OTHER): Payer: No Typology Code available for payment source | Admitting: Internal Medicine

## 2017-03-15 ENCOUNTER — Encounter: Payer: Self-pay | Admitting: Internal Medicine

## 2017-03-15 DIAGNOSIS — M4626 Osteomyelitis of vertebra, lumbar region: Secondary | ICD-10-CM

## 2017-03-15 MED ORDER — LEVOFLOXACIN 500 MG PO TABS: 500.0000 mg | ORAL_TABLET | ORAL | 0 refills | Status: AC

## 2017-03-15 NOTE — Assessment & Plan Note (Signed)
She is improving.  Given the duration of her illness and the extent of complications I favor extending her treatment to at least 8 weeks.  She will have repeat inflammatory markers obtained today and follow-up on 04/02/2017.

## 2017-03-15 NOTE — Progress Notes (Signed)
[facility]  Patient Active Problem List   Diagnosis Date Noted  . Allergic reaction caused by a drug 03/07/2017  . Pelvic abscess in female 02/23/2017  . 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 Psoas abscess, right (HCC) 02/07/2017    Patient's Medications  New Prescriptions   No medications on file  Previous Medications   METHOCARBAMOL (ROBAXIN) 500 MG TABLET    TK 2 TS PO Q 6 TO 8 H PRN FOR INFLAMMATION AND PAIN   OXYCODONE-ACETAMINOPHEN (PERCOCET) 7.5-325 MG TABLET    Take 1 tablet by mouth every 4 (four) hours as needed for moderate pain or severe pain.   SENNA (SENOKOT) 8.6 MG TABS TABLET    Take 1 tablet (8.6 mg total) by mouth daily as needed for mild constipation.  Modified Medications   Modified Medication Previous Medication   LEVOFLOXACIN (LEVAQUIN) 500 MG TABLET levofloxacin (LEVAQUIN) 500 MG tablet      Take 1 tablet (500 mg total) by mouth daily for 18 days.    Take 1 tablet (500 mg total) by mouth daily.  Discontinued Medications   No medications on file    Subjective: Joy Morris is in for her routine follow-up visit.  She is now completed 5 weeks of therapy for her streptococcal L2-3 discitis complicated by multiple paraspinal, abdominal wall and pelvic abscesses.  She is feeling much better.  She is not needing the hydrocodone very often and she is usually getting by with extra strength acetaminophen.  She is resuming most of her usual activities and plans on returning to work next Monday.  She had one episode of nausea and vomiting 2 days ago but otherwise has been feeling well.  She has had no problems tolerating levofloxacin.  Review of Systems: Review of Systems  Constitutional: Negative for chills, diaphoresis, fever and weight loss.  Gastrointestinal: Positive for abdominal pain, nausea and vomiting. Negative for diarrhea.       Her mild right lower quadrant tenderness is improving.  Genitourinary: Negative for  dysuria.  Musculoskeletal: Positive for back pain.       Her mild right lower back pain is improving.  Neurological: Negative for dizziness and headaches.    Past Medical History:  Diagnosis Date  . 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 Smoking status: Never Smoker  . Smokeless tobacco: Never Used  Substance Use Topics  . Alcohol use: No    Frequency: Never  . Drug use: No    Family History  Problem Relation Age of Onset  . Hypertension Mother   . Hyperlipidemia Mother   . Leukemia Father        CML    Allergies  Allergen Reactions  . Ceftriaxone Itching and Rash    Fever, soft stool    Objective: Vitals:   03/15/17 0905  BP: 133/86  Pulse: 94  Temp: (!) 97.5 F (36.4 C)  TempSrc: Oral  Weight: 147 lb (66.7 kg)   Body mass index is 27.78 kg/m.  Physical Exam  Constitutional: She is oriented to person, place, and time.  She appears to be feeling much better.  HENT:  Mouth/Throat: No oropharyngeal exudate.  Eyes: Conjunctivae are normal.  Cardiovascular: Normal rate and regular rhythm.  No murmur heard. Pulmonary/Chest: Effort normal and breath sounds normal.  Abdominal: Soft. She exhibits no distension and no mass. There is tenderness.  Mild right lower quadrant  tenderness with palpation.  Musculoskeletal: Normal range of motion.  Neurological: She is alert and oriented to person, place, and time. Gait normal.  Skin: No rash noted.  Psychiatric: Mood and affect normal.    Lab Results Sed Rate (mm/h)  Date Value  03/07/2017 50 (H)   CRP (mg/L)  Date Value  03/07/2017 4.0     Problem List Items Addressed This Visit      Unprioritized   Osteomyelitis of lumbar spine (HCC)    She is improving.  Given the duration of her illness and the extent of complications I favor extending her treatment to at least 8 weeks.  She will have repeat inflammatory markers obtained today and follow-up on 04/02/2017.       Relevant Orders   C-reactive protein   Sedimentation rate       Cliffton Asters, MD [facility] [phone] pager   [phone] cell 03/15/2017, 9:37 AM

## 2017-03-16 LAB — C-REACTIVE PROTEIN: CRP: 2 mg/L (ref <8.0)

## 2017-03-16 LAB — SEDIMENTATION RATE: Sed Rate: 36 mm/h — ABNORMAL HIGH (ref 0–20)

## 2017-04-02 ENCOUNTER — Ambulatory Visit: Payer: No Typology Code available for payment source | Admitting: Internal Medicine

## 2017-04-02 ENCOUNTER — Encounter: Payer: Self-pay | Admitting: Internal Medicine

## 2017-04-10 ENCOUNTER — Ambulatory Visit (INDEPENDENT_AMBULATORY_CARE_PROVIDER_SITE_OTHER): Payer: No Typology Code available for payment source | Admitting: Internal Medicine

## 2017-04-10 ENCOUNTER — Encounter: Payer: Self-pay | Admitting: Internal Medicine

## 2017-04-10 DIAGNOSIS — M4626 Osteomyelitis of vertebra, lumbar region: Secondary | ICD-10-CM

## 2017-04-10 NOTE — Progress Notes (Signed)
         [facility]  Patient Active Problem List   Diagnosis Date Noted  . Allergic reaction caused by a drug 03/07/2017  . Pelvic abscess in female 02/23/2017  . 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 Psoas abscess, right (HCC) 02/07/2017    Patient's Medications  New Prescriptions   No medications on file  Previous Medications   METHOCARBAMOL (ROBAXIN) 500 MG TABLET    TK 2 TS PO Q 6 TO 8 H PRN FOR INFLAMMATION AND PAIN   OXYCODONE-ACETAMINOPHEN (PERCOCET) 7.5-325 MG TABLET    Take 1 tablet by mouth every 4 (four) hours as needed for moderate pain or severe pain.   SENNA (SENOKOT) 8.6 MG TABS TABLET    Take 1 tablet (8.6 mg total) by mouth daily as needed for mild constipation.  Modified Medications   No medications on file  Discontinued Medications   No medications on file    Subjective: Anivea is in for her routine follow-up visit.  She completed 8 weeks of antibiotic therapy for lumbar discitis and pelvic abscesses on 04/02/2017.  Toward the end of her therapy she began to develop some stiffness in her joints.  She was not sure if that was a side effect of levofloxacin or whether it was due to starting back to work.  She is feeling better now.  Her joints are not hurting as much and her back pain and abdominal pain are improving.  Review of Systems: Review of Systems  Constitutional: Negative for chills, diaphoresis, fever and weight loss.  Gastrointestinal: Positive for abdominal pain. Negative for diarrhea, nausea and vomiting.  Musculoskeletal: Positive for back pain and joint pain.  Skin: Negative for rash.    Past Medical History:  Diagnosis Date  . 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 Smoking status: Never Smoker  . Smokeless tobacco: Never Used  Substance Use Topics  . Alcohol use: No    Frequency: Never  . Drug use: No    Family History  Problem Relation Age of Onset  .  Hypertension Mother   . Hyperlipidemia Mother   . Leukemia Father        CML    Allergies  Allergen Reactions  . Ceftriaxone Itching and Rash    Fever, soft stool    Objective: Vitals:   04/10/17 1107  BP: 114/77  Pulse: 81  Temp: 98.1 F (36.7 C)  TempSrc: Oral  Weight: 148 lb (67.1 kg)  Height: 5\' 1"  (1.549 m)   Body mass index is 27.96 kg/m.  Physical Exam  Constitutional: She is oriented to person, place, and time.  She is in good spirits.  Abdominal: Soft. She exhibits no distension. There is no tenderness.  Musculoskeletal: Normal range of motion. She exhibits no edema or tenderness.  Neurological: She is alert and oriented to person, place, and time.  Skin: No rash noted.  Psychiatric: She has a normal mood and affect.    Lab Results Sed Rate (mm/h)  Date Value  03/15/2017 36 (H)  03/07/2017 50 (H)   CRP (mg/L)  Date Value  03/15/2017 2.0  03/07/2017 4.0     Problem List Items Addressed This Visit    None       Cliffton AstersJohn Marquita Lias, MD [facility] [phone] pager   [phone] cell 04/10/2017, 11:18 AM

## 2017-04-10 NOTE — Assessment & Plan Note (Signed)
I am hopeful that her lumbar infection and associated pelvic abscesses have been cured but only more time will tell.  She will stay off of antibiotics and follow-up here in 4 weeks.

## 2017-05-10 ENCOUNTER — Encounter: Payer: Self-pay | Admitting: Internal Medicine

## 2017-05-15 ENCOUNTER — Ambulatory Visit: Payer: No Typology Code available for payment source | Admitting: Internal Medicine

## 2017-06-28 ENCOUNTER — Encounter: Payer: Self-pay | Admitting: Internal Medicine

## 2017-06-29 ENCOUNTER — Telehealth: Payer: Self-pay | Admitting: Internal Medicine

## 2017-06-29 NOTE — Telephone Encounter (Signed)
Received this message from [place].  Hello Dr. Orvan Falconerampbell,   it's Joy Morris dob 10/01/1975   I'm hoping this is a non-urgent question.   I just started having some lower back pain last night. The pain is more than just aches and pains. This continue through the night into today.  If this persists or gets worse in the next day or two, I just wanted to check with you on what should be my first plan of action.   Should I make an appointment to come have blood work before any CT or MRI or what is your suggestion? I definitely don't want to wait several weeks like I did with my last infection and back issue.  Thank you for your time.  Joy Morris   I called her today.  She says that she had sudden onset of fairly severe low back pain 2 days ago while standing at the kitchen sink.  She said it felt like the pain that she had from her previous vertebral infection and pelvic abscesses.  She took some ibuprofen.  When she woke yesterday she had only dull aching pain and she is feeling even better today.  She has not had any fever, chills or sweats.  She completed 8 weeks of IV antibiotic therapy 3 months ago.  I doubt that her new pain is due to relapsed infection, especially since she is improving spontaneously.  We agreed to simply observe for now.  She will let me know how she is doing in 2-3 more days.

## 2019-02-13 ENCOUNTER — Ambulatory Visit: Payer: No Typology Code available for payment source

## 2019-02-21 ENCOUNTER — Ambulatory Visit: Payer: No Typology Code available for payment source | Attending: Internal Medicine

## 2019-02-21 DIAGNOSIS — Z23 Encounter for immunization: Secondary | ICD-10-CM | POA: Insufficient documentation

## 2019-02-21 NOTE — Progress Notes (Signed)
   Covid-19 Vaccination Clinic  Name:  Joy Morris    MRN: [id number] DOB: [birth year]  02/21/2019  [name] was observed post Covid-19 immunization for 15 minutes without incidence. She was provided with Vaccine Information Sheet and instruction to access the V-Safe system.   [name] was instructed to call 911 with any severe reactions post vaccine: Marland Kitchen Difficulty breathing  . Swelling of your face and throat  . A fast heartbeat  . A bad rash all over your body  . Dizziness and weakness    Immunizations Administered    Name Date Dose VIS Date Route   Pfizer COVID-19 Vaccine 02/21/2019  1:41 PM 0.3 mL 12/13/2018 Intramuscular   Manufacturer: [employer], [employer]   Lot: [id number]   NDC: [address]-2

## 2019-03-18 ENCOUNTER — Ambulatory Visit: Payer: No Typology Code available for payment source | Attending: Internal Medicine

## 2019-03-18 DIAGNOSIS — Z23 Encounter for immunization: Secondary | ICD-10-CM

## 2019-03-18 NOTE — Progress Notes (Signed)
   Covid-19 Vaccination Clinic  Name:  Joy Morris    MRN: [id number] DOB: 09-26-1975  03/18/2019  [name] was observed post Covid-19 immunization for 15 minutes without incident. She was provided with Vaccine Information Sheet and instruction to access the V-Safe system.   [name] was instructed to call 911 with any severe reactions post vaccine: Marland Kitchen Difficulty breathing  . Swelling of face and throat  . A fast heartbeat  . A bad rash all over body  . Dizziness and weakness   Immunizations Administered    Name Date Dose VIS Date Route   Pfizer COVID-19 Vaccine 03/18/2019  4:45 PM 0.3 mL 12/13/2018 Intramuscular   Manufacturer: [employer], [employer]   Lot: [id number]   NDC: [address]-9

## 2019-08-05 IMAGING — CT CT IMAGE GUIDED FLUID DRAIN BY CATHETER
1 of 3 series · 12 of 32 positions shown, 18 images · non-contrast
Comparison: none

INDICATION: Right lower quadrant and anterior pelvic abscesses
TECHNIQUE: Informed written consent was obtained from the patient after a
thorough discussion of the procedural risks, benefits and
alternatives. All questions were addressed. Maximal Sterile Barrier
Technique was utilized including caps, mask, sterile gowns, sterile
gloves, sterile drape, hand hygiene and skin antiseptic. A timeout
was performed prior to the initiation of the procedure.

[Series 2: i-spiral 5.0 b31f · axial · 0.98mm/px · z∈[-172,-39]mm · 12 of 46 slices shown, 18 images]
[im 4/46  soft-tissue]
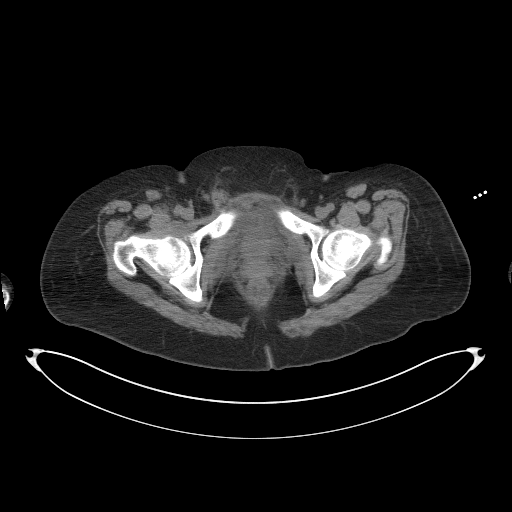
[im 4/46  bone]
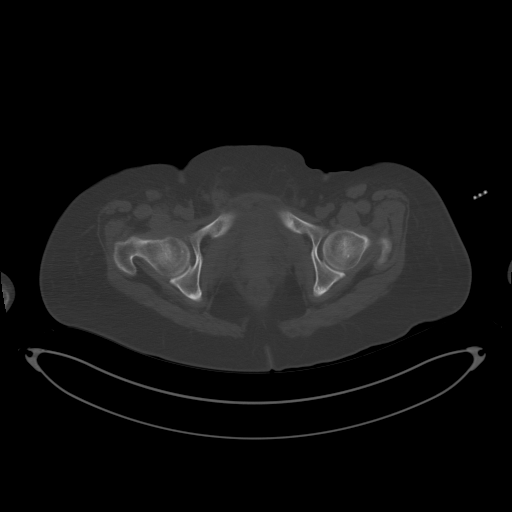
[im 7/46  soft-tissue]
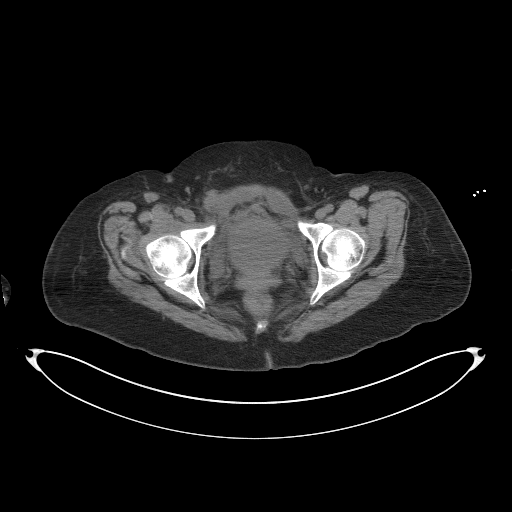
[im 11/46  soft-tissue]
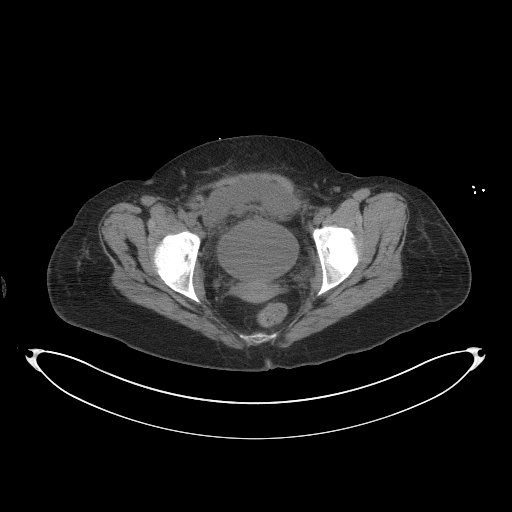
[im 14/46  soft-tissue]
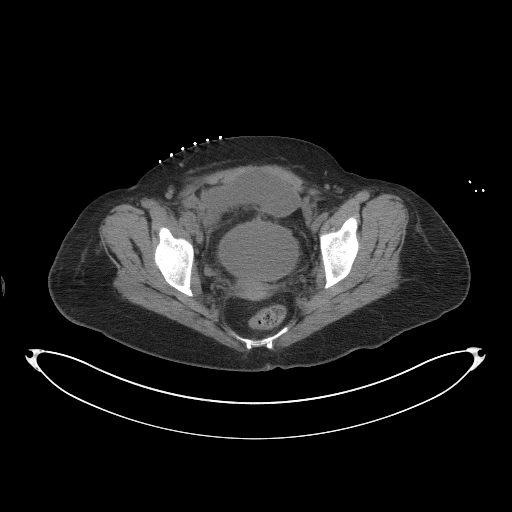
[im 18/46  soft-tissue]
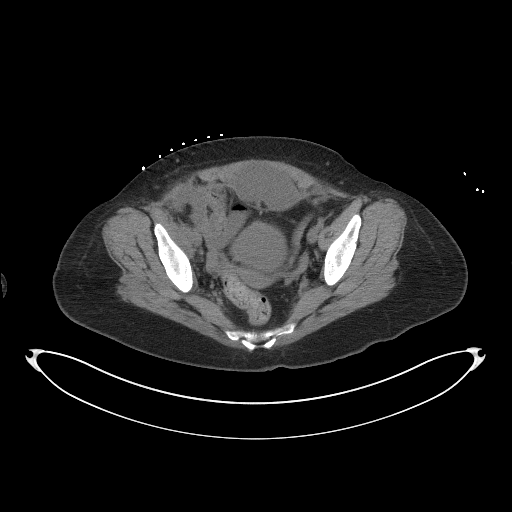
[im 21/46  soft-tissue]
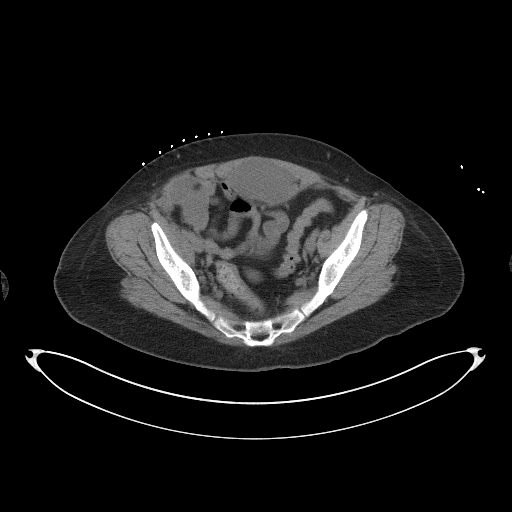
[im 25/46  soft-tissue]
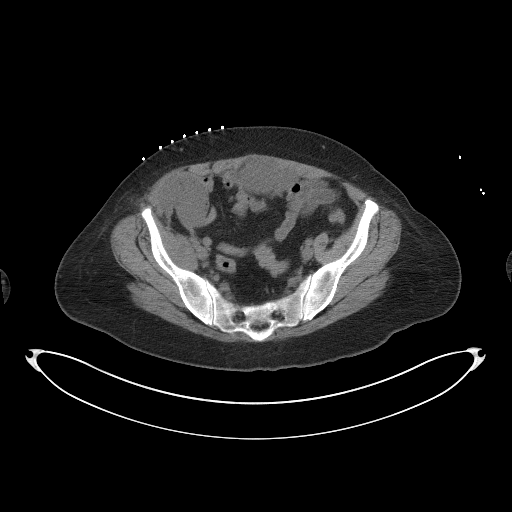
[im 28/46  soft-tissue]
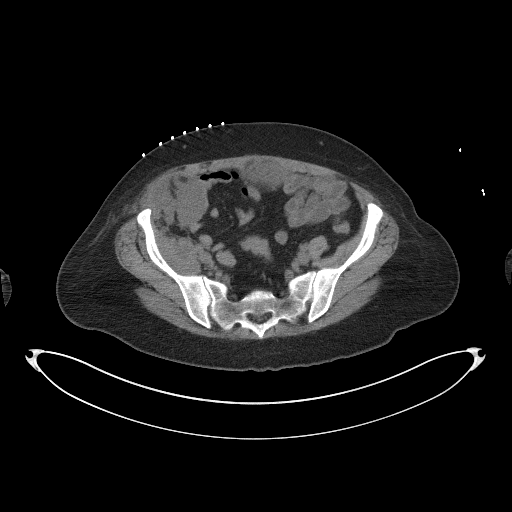
[im 32/46  soft-tissue]
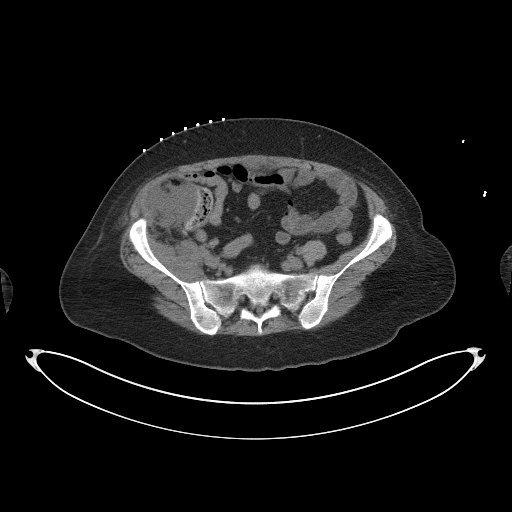
[im 32/46  lung]
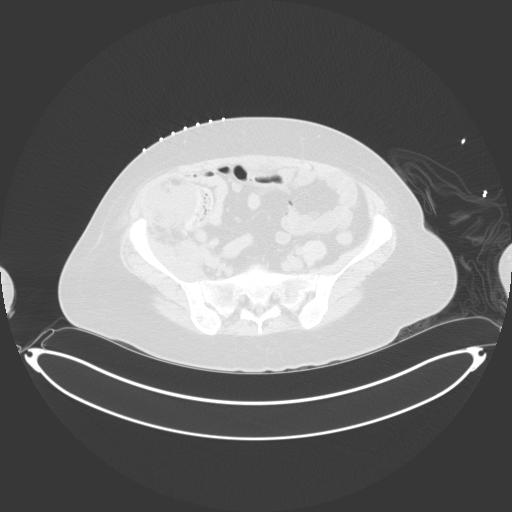
[im 32/46  bone]
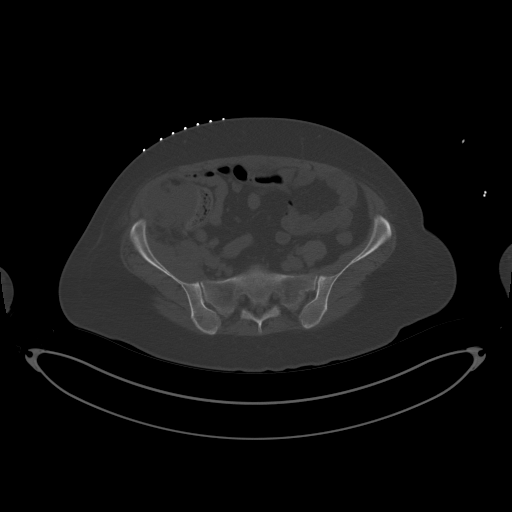
[im 35/46  soft-tissue]
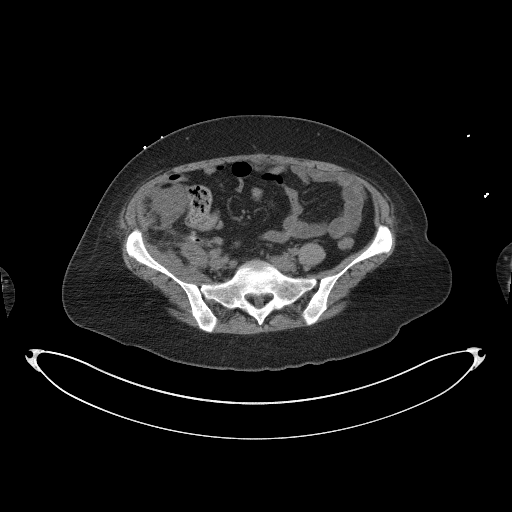
[im 35/46  lung]
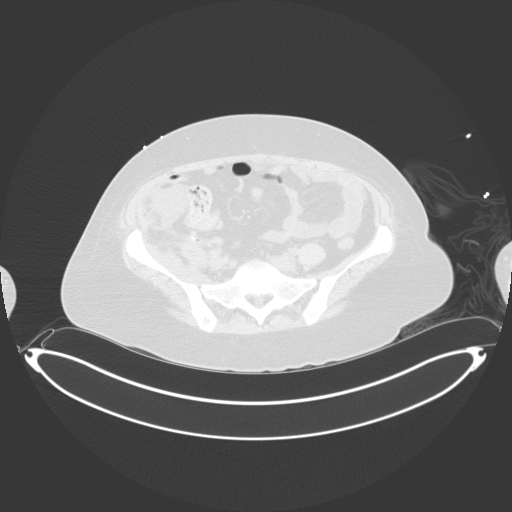
[im 39/46  soft-tissue]
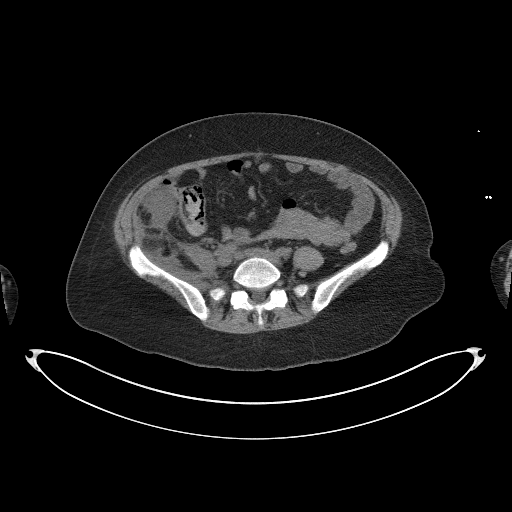
[im 39/46  lung]
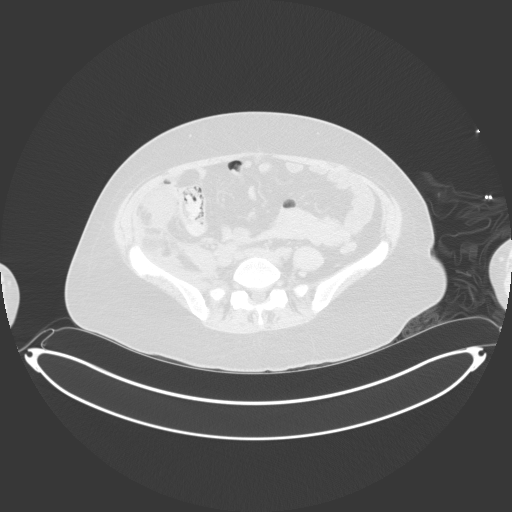
[im 42/46  soft-tissue]
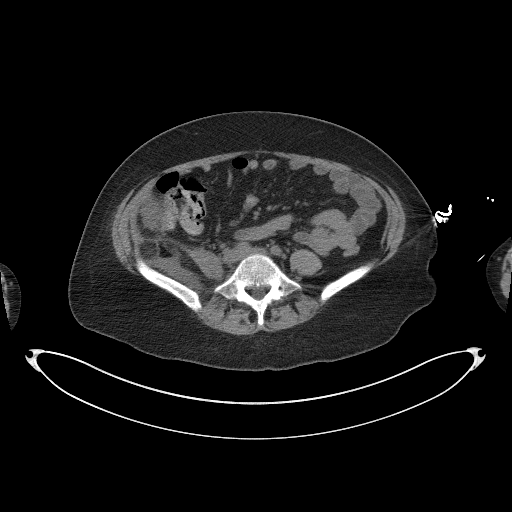
[im 42/46  lung]
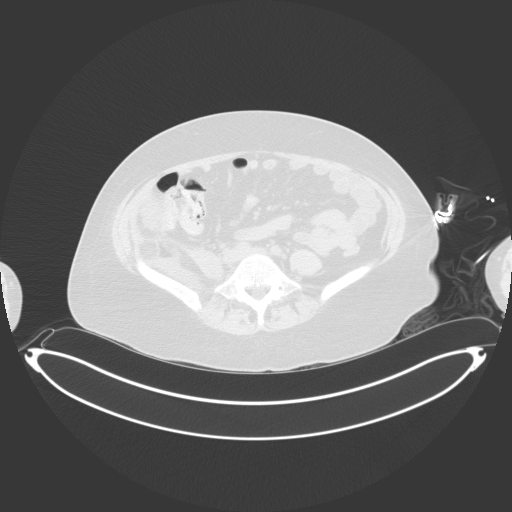

[12 of 32 positions shown; findings below may reference images not displayed]

EXAM:
CT GUIDED DRAINAGE OF  2 PELVIC ABSCESSES

MEDICATIONS:
The patient is BEING admitted to the hospital and receiving
intravenous antibiotics. She currently is on oral Levaquin.

ANESTHESIA/SEDATION:
3.0 mg IV Versed 150 mcg IV Fentanyl

Moderate Sedation Time:  13 minutes

The patient was continuously monitored during the procedure by the
interventional radiology nurse under my direct supervision.

COMPLICATIONS:
None immediate.
PROCEDURE:
Previous imaging reviewed. Patient positioned supine. Noncontrast
localization CT performed. The right lower quadrant anterior pelvic
abscess were localized. Overlying areas were marked.

Right lower quadrant pelvic abscess drain: Under sterile conditions
and local anesthesia, CT guidance was utilized to advance an 18
gauge 10 cm needle into the right lower quadrant abscess. Needle
position confirmed with C T. Syringe aspiration yielded purulent
fluid. Sample sent for culture. Guidewire inserted followed by tract
dilatation to insert a 12 French drain. Drain catheter position
confirmed with CT. Catheter secured with a Prolene suture and
connected to external suction bulb. Sterile dressing applied.

Anterior pelvic abscess drain: Also under sterile conditions and
local anesthesia, CT guidance was utilized to advance an 18 gauge
needle into the anterior pelvic fluid collection. Needle position
confirmed with CT and aspiration yielded purulent fluid sent for
culture. Twelve French drain inserted over a guidewire. Drain
catheter position confirmed with CT. Catheter secured with a Prolene
suture and connected to external suction bulb. Sterile dressing
applied. No immediate complication
FINDINGS: Imaging confirms drainage of the right lower quadrant and pelvic
abscess.
IMPRESSION: Successful CT-guided right lower quadrant and anterior pelvic
abscess drains

## 2021-02-16 ENCOUNTER — Emergency Department (HOSPITAL_COMMUNITY)
Admission: EM | Admit: 2021-02-16 | Discharge: 2021-02-17 | Disposition: A | Discharge status: Home / Self Care | Payer: No Typology Code available for payment source | Attending: Emergency Medicine | Admitting: Emergency Medicine

## 2021-02-16 ENCOUNTER — Emergency Department (HOSPITAL_COMMUNITY): Payer: No Typology Code available for payment source

## 2021-02-16 DIAGNOSIS — Z23 Encounter for immunization: Secondary | ICD-10-CM | POA: Diagnosis not present

## 2021-02-16 DIAGNOSIS — Y92410 Unspecified street and highway as the place of occurrence of the external cause: Secondary | ICD-10-CM | POA: Insufficient documentation

## 2021-02-16 DIAGNOSIS — T07XXXA Unspecified multiple injuries, initial encounter: Secondary | ICD-10-CM

## 2021-02-16 DIAGNOSIS — S8002XA Contusion of left knee, initial encounter: Secondary | ICD-10-CM | POA: Diagnosis not present

## 2021-02-16 DIAGNOSIS — S8991XA Unspecified injury of right lower leg, initial encounter: Secondary | ICD-10-CM | POA: Diagnosis present

## 2021-02-16 DIAGNOSIS — S7012XA Contusion of left thigh, initial encounter: Secondary | ICD-10-CM | POA: Insufficient documentation

## 2021-02-16 DIAGNOSIS — S5002XA Contusion of left elbow, initial encounter: Secondary | ICD-10-CM | POA: Diagnosis not present

## 2021-02-16 DIAGNOSIS — S8001XA Contusion of right knee, initial encounter: Secondary | ICD-10-CM | POA: Insufficient documentation

## 2021-02-16 DIAGNOSIS — S8011XA Contusion of right lower leg, initial encounter: Secondary | ICD-10-CM | POA: Diagnosis not present

## 2021-02-16 MED ORDER — TETANUS-DIPHTH-ACELL PERTUSSIS 5-2.5-18.5 LF-MCG/0.5 IM SUSY
0.5000 mL | PREFILLED_SYRINGE | Freq: Once | INTRAMUSCULAR | Status: AC
  Administered 2021-02-17: 0.5 mL via INTRAMUSCULAR
  Filled 2021-02-16: qty 0.5
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
## 2021-02-17 ENCOUNTER — Other Ambulatory Visit: Payer: Self-pay

## 2021-02-17 ENCOUNTER — Encounter (HOSPITAL_COMMUNITY): Payer: Self-pay

## 2021-02-17 MED ORDER — CYCLOBENZAPRINE HCL 10 MG PO TABS
10.0000 mg | ORAL_TABLET | Freq: Once | ORAL | Status: AC
  Administered 2021-02-17: 10 mg via ORAL
  Filled 2021-02-17: qty 1

## 2021-02-17 MED ORDER — CYCLOBENZAPRINE HCL 10 MG PO TABS: 10.0000 mg | ORAL_TABLET | Freq: Two times a day (BID) | ORAL | 0 refills | Status: DC | PRN

## 2021-02-17 MED ORDER — OXYCODONE-ACETAMINOPHEN 5-325 MG PO TABS
1.0000 | ORAL_TABLET | Freq: Once | ORAL | Status: AC
  Administered 2021-02-17: 1 via ORAL
  Filled 2021-02-17: qty 1

## 2021-02-17 MED ORDER — IBUPROFEN 200 MG PO TABS
600.0000 mg | ORAL_TABLET | Freq: Once | ORAL | Status: AC
  Administered 2021-02-17: 600 mg via ORAL
  Filled 2021-02-17: qty 1

## 2021-02-17 MED ORDER — OXYCODONE-ACETAMINOPHEN 5-325 MG PO TABS: 1.0000 | ORAL_TABLET | Freq: Four times a day (QID) | ORAL | 0 refills | Status: DC | PRN
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
## 2021-03-04 ENCOUNTER — Ambulatory Visit (INDEPENDENT_AMBULATORY_CARE_PROVIDER_SITE_OTHER): Payer: No Typology Code available for payment source | Admitting: Plastic Surgery

## 2021-03-04 ENCOUNTER — Encounter: Payer: Self-pay | Admitting: Plastic Surgery

## 2021-03-04 ENCOUNTER — Other Ambulatory Visit: Payer: Self-pay

## 2021-03-04 VITALS — BP 153/91 | HR 92 | Ht 64.0 in | Wt 167.2 lb

## 2021-03-04 DIAGNOSIS — S8011XA Contusion of right lower leg, initial encounter: Secondary | ICD-10-CM
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
## 2021-03-08 ENCOUNTER — Other Ambulatory Visit: Payer: Self-pay

## 2021-03-08 ENCOUNTER — Encounter (HOSPITAL_COMMUNITY): Payer: Self-pay | Admitting: Plastic Surgery

## 2021-03-08 NOTE — Pre-Procedure Instructions (Signed)
Surgical Instructions ? ? ? Your procedure is scheduled on Thursday, March 10, 2021 at 1:45PM. ? Report to [facility] Main Entrance "A" at 11:15 A.M., then check in with the Admitting office. ? Call this number if you have problems the morning of surgery: ? [phone] ? ? If you have any questions prior to your surgery date call [phone]: Open Monday-Friday 8am-4pm ? ? ? Remember: ? Do not eat after midnight the night before your surgery ? ?You may drink clear liquids until 10:45 AM the morning of your surgery.   ?Clear liquids allowed are: Water, Non-Citrus Juices (without pulp), Carbonated Beverages, Clear Tea, Black Coffee Only (NO MILK, CREAM OR POWDERED CREAMER of any kind), and Gatorade. ?  ? Take these medicines the morning of surgery with A SIP OF WATER: ? ?sulfamethoxazole-trimethoprim (BACTRIM DS)  ?cyclobenzaprine (FLEXERIL) - if needed ? ?As of today, STOP taking any Aspirin (unless otherwise instructed by your surgeon) Aleve, Naproxen, Ibuprofen, Motrin, Advil, Goody's, BC's, all herbal medications, fish oil, and all vitamins. ?         ?           ?Do NOT Smoke (Tobacco/Vaping) for 24 hours prior to your procedure. ? ?If you use a CPAP at night, you may bring your mask/headgear for your overnight stay. ?  ?Contacts, glasses, piercing's, hearing aid's, dentures or partials may not be worn into surgery, please bring cases for these belongings.  ?  ?For patients admitted to the hospital, discharge time will be determined by your treatment team. ?  ?Patients discharged the day of surgery will not be allowed to drive home, and someone needs to stay with them for 24 hours. ? ?NO VISITORS WILL BE ALLOWED IN PRE-OP WHERE PATIENTS ARE PREPPED FOR SURGERY.  ONLY 1 SUPPORT PERSON MAY BE PRESENT IN THE WAITING ROOM WHILE YOU ARE IN SURGERY.  IF YOU ARE TO BE ADMITTED, ONCE YOU ARE IN YOUR ROOM YOU WILL BE ALLOWED TWO (2) VISITORS. (1) VISITOR MAY STAY OVERNIGHT BUT MUST ARRIVE TO THE ROOM BY 8pm.  Minor  children may have two parents present. Special consideration for safety and communication needs will be reviewed on a case by case basis. ? ? ?Day of Surgery: ?Take a shower with mild soap. ?Do not wear jewelry or makeup ?Do not wear lotions, powders, perfumes, or deodorant. ?Do not shave 48 hours prior to surgery. ?Do not bring valuables to the hospital.  ?[facility] is not responsible for any belongings or valuables. ?Do not wear nail polish, gel polish, artificial nails, or any other type of covering on natural nails (fingers and toes) ?If you have artificial nails or gel coating that need to be removed by a nail salon, please have this removed prior to surgery. Artificial nails or gel coating may interfere with anesthesia's ability to adequately monitor your vital signs. ?Wear Clean/Comfortable clothing the morning of surgery ?Do not apply any deodorants/lotions.   ?Remember to brush your teeth WITH YOUR REGULAR TOOTHPASTE. ?  ?Please read over the following fact sheets that you were given. ?

## 2021-03-10 ENCOUNTER — Ambulatory Visit (HOSPITAL_BASED_OUTPATIENT_CLINIC_OR_DEPARTMENT_OTHER): Payer: No Typology Code available for payment source | Admitting: Anesthesiology

## 2021-03-10 ENCOUNTER — Ambulatory Visit (HOSPITAL_COMMUNITY)
Admission: RE | Admit: 2021-03-10 | Discharge: 2021-03-10 | Disposition: A | Discharge status: Home / Self Care | Payer: No Typology Code available for payment source | Attending: Plastic Surgery | Admitting: Plastic Surgery

## 2021-03-10 ENCOUNTER — Ambulatory Visit (HOSPITAL_COMMUNITY): Payer: No Typology Code available for payment source | Admitting: Anesthesiology

## 2021-03-10 ENCOUNTER — Other Ambulatory Visit: Payer: Self-pay

## 2021-03-10 ENCOUNTER — Encounter (HOSPITAL_COMMUNITY)
Admission: RE | Disposition: A | Discharge status: Home / Self Care | Payer: Self-pay | Source: Home / Self Care | Attending: Plastic Surgery

## 2021-03-10 DIAGNOSIS — S8011XA Contusion of right lower leg, initial encounter: Secondary | ICD-10-CM | POA: Insufficient documentation

## 2021-03-10 HISTORY — PX: HEMATOMA EVACUATION: SHX5118

## 2021-03-10 LAB — CBC
HCT: 40.4 % (ref 36.0–46.0)
Hemoglobin: 12.9 g/dL (ref 12.0–15.0)
MCH: 28.8 pg (ref 26.0–34.0)
MCHC: 31.9 g/dL (ref 30.0–36.0)
MCV: 90.2 fL (ref 80.0–100.0)
Platelets: 274 10*3/uL (ref 150–400)
RBC: 4.48 MIL/uL (ref 3.87–5.11)
RDW: 12.7 % (ref 11.5–15.5)
WBC: 3.9 10*3/uL — ABNORMAL LOW (ref 4.0–10.5)
nRBC: 0 % (ref 0.0–0.2)

## 2021-03-10 SURGERY — EVACUATION HEMATOMA
Anesthesia: General | Site: Leg Lower | Laterality: Right

## 2021-03-10 MED ORDER — CHLORHEXIDINE GLUCONATE CLOTH 2 % EX PADS: 6.0000 | MEDICATED_PAD | Freq: Once | CUTANEOUS | Status: DC

## 2021-03-10 MED ORDER — OXYCODONE HCL 5 MG PO TABS: 5.0000 mg | ORAL_TABLET | ORAL | 0 refills | Status: AC | PRN

## 2021-03-10 MED ORDER — ACETAMINOPHEN 10 MG/ML IV SOLN
INTRAVENOUS | Status: DC | PRN
  Administered 2021-03-10: 1000 mg via INTRAVENOUS

## 2021-03-10 MED ORDER — KETOROLAC TROMETHAMINE 30 MG/ML IJ SOLN
INTRAMUSCULAR | Status: DC | PRN
  Administered 2021-03-10: 30 mg via INTRAVENOUS

## 2021-03-10 MED ORDER — KETOROLAC TROMETHAMINE 30 MG/ML IJ SOLN
INTRAMUSCULAR | Status: AC
  Filled 2021-03-10: qty 1

## 2021-03-10 MED ORDER — SODIUM CHLORIDE 0.9 % IR SOLN
Status: DC | PRN
  Administered 2021-03-10: 1000 mL

## 2021-03-10 MED ORDER — OXYCODONE HCL 5 MG PO TABS: 5.0000 mg | ORAL_TABLET | Freq: Once | ORAL | Status: DC | PRN

## 2021-03-10 MED ORDER — CHLORHEXIDINE GLUCONATE 0.12 % MT SOLN: 15.0000 mL | Freq: Once | OROMUCOSAL | Status: AC

## 2021-03-10 MED ORDER — CEFAZOLIN SODIUM-DEXTROSE 2-4 GM/100ML-% IV SOLN
2.0000 g | INTRAVENOUS | Status: AC
  Administered 2021-03-10: 15:00:00 2 g via INTRAVENOUS

## 2021-03-10 MED ORDER — ORAL CARE MOUTH RINSE: 15.0000 mL | Freq: Once | OROMUCOSAL | Status: AC

## 2021-03-10 MED ORDER — ONDANSETRON HCL 4 MG/2ML IJ SOLN
INTRAMUSCULAR | Status: DC | PRN
  Administered 2021-03-10: 4 mg via INTRAVENOUS

## 2021-03-10 MED ORDER — PROPOFOL 10 MG/ML IV BOLUS
INTRAVENOUS | Status: DC | PRN
  Administered 2021-03-10: 200 mg via INTRAVENOUS

## 2021-03-10 MED ORDER — 0.9 % SODIUM CHLORIDE (POUR BTL) OPTIME
TOPICAL | Status: DC | PRN
  Administered 2021-03-10: 15:00:00 1000 mL

## 2021-03-10 MED ORDER — ACETAMINOPHEN 500 MG PO TABS: 1000.0000 mg | ORAL_TABLET | Freq: Once | ORAL | Status: DC | PRN

## 2021-03-10 MED ORDER — FENTANYL CITRATE (PF) 250 MCG/5ML IJ SOLN
INTRAMUSCULAR | Status: AC
  Filled 2021-03-10: qty 5

## 2021-03-10 MED ORDER — DEXAMETHASONE SODIUM PHOSPHATE 10 MG/ML IJ SOLN
INTRAMUSCULAR | Status: DC | PRN
  Administered 2021-03-10: 10 mg via INTRAVENOUS

## 2021-03-10 MED ORDER — FENTANYL CITRATE (PF) 100 MCG/2ML IJ SOLN
INTRAMUSCULAR | Status: AC
  Filled 2021-03-10: qty 2

## 2021-03-10 MED ORDER — ACETAMINOPHEN 160 MG/5ML PO SOLN: 1000.0000 mg | Freq: Once | ORAL | Status: DC | PRN

## 2021-03-10 MED ORDER — FENTANYL CITRATE (PF) 100 MCG/2ML IJ SOLN
INTRAMUSCULAR | Status: DC | PRN
  Administered 2021-03-10 (×4): 25 ug via INTRAVENOUS

## 2021-03-10 MED ORDER — LACTATED RINGERS IV SOLN: INTRAVENOUS | Status: DC

## 2021-03-10 MED ORDER — PROPOFOL 10 MG/ML IV BOLUS
INTRAVENOUS | Status: AC
  Filled 2021-03-10: qty 20

## 2021-03-10 MED ORDER — BUPIVACAINE-EPINEPHRINE (PF) 0.25% -1:200000 IJ SOLN
INTRAMUSCULAR | Status: AC
  Filled 2021-03-10: qty 30

## 2021-03-10 MED ORDER — FENTANYL CITRATE (PF) 100 MCG/2ML IJ SOLN
25.0000 ug | INTRAMUSCULAR | Status: DC | PRN
  Administered 2021-03-10: 16:00:00 25 ug via INTRAVENOUS

## 2021-03-10 MED ORDER — ACETAMINOPHEN 10 MG/ML IV SOLN: 1000.0000 mg | Freq: Once | INTRAVENOUS | Status: DC | PRN

## 2021-03-10 MED ORDER — ACETAMINOPHEN 10 MG/ML IV SOLN
INTRAVENOUS | Status: AC
  Filled 2021-03-10: qty 100

## 2021-03-10 MED ORDER — MIDAZOLAM HCL 2 MG/2ML IJ SOLN
INTRAMUSCULAR | Status: AC
  Filled 2021-03-10: qty 2

## 2021-03-10 MED ORDER — LIDOCAINE HCL 1 % IJ SOLN
INTRAMUSCULAR | Status: DC | PRN
  Administered 2021-03-10: 15 mL

## 2021-03-10 MED ORDER — CEFAZOLIN SODIUM-DEXTROSE 2-4 GM/100ML-% IV SOLN
INTRAVENOUS | Status: AC
  Filled 2021-03-10: qty 100

## 2021-03-10 MED ORDER — LIDOCAINE 2% (20 MG/ML) 5 ML SYRINGE
INTRAMUSCULAR | Status: DC | PRN
  Administered 2021-03-10: 80 mg via INTRAVENOUS

## 2021-03-10 MED ORDER — MIDAZOLAM HCL 5 MG/5ML IJ SOLN
INTRAMUSCULAR | Status: DC | PRN
Start: 2021-03-10 — End: 2021-03-10
  Administered 2021-03-10: 2 mg via INTRAVENOUS

## 2021-03-10 MED ORDER — OXYCODONE HCL 5 MG/5ML PO SOLN: 5.0000 mg | Freq: Once | ORAL | Status: DC | PRN

## 2021-03-10 MED ORDER — CHLORHEXIDINE GLUCONATE 0.12 % MT SOLN
OROMUCOSAL | Status: AC
  Administered 2021-03-10: 12:00:00 15 mL via OROMUCOSAL
  Filled 2021-03-10: qty 15

## 2021-03-10 SURGICAL SUPPLY — 63 items
APPLICATOR COTTON TIP 6 STRL (MISCELLANEOUS) IMPLANT
APPLICATOR COTTON TIP 6IN STRL (MISCELLANEOUS)
BAG COUNTER SPONGE SURGICOUNT (BAG) ×2 IMPLANT
BAG DECANTER FOR FLEXI CONT (MISCELLANEOUS) IMPLANT
BENZOIN TINCTURE PRP APPL 2/3 (GAUZE/BANDAGES/DRESSINGS) ×2 IMPLANT
BIOPATCH RED 1 DISK 7.0 (GAUZE/BANDAGES/DRESSINGS) ×1 IMPLANT
CANISTER SUCT 3000ML PPV (MISCELLANEOUS) ×2 IMPLANT
CNTNR URN SCR LID CUP LEK RST (MISCELLANEOUS) IMPLANT
CONT SPEC 4OZ STRL OR WHT (MISCELLANEOUS)
COVER SURGICAL LIGHT HANDLE (MISCELLANEOUS) ×2 IMPLANT
DRAIN CHANNEL 19F RND (DRAIN) IMPLANT
DRAIN JP 10F RND SILICONE (MISCELLANEOUS) IMPLANT
DRAIN JP 15F RND TROCAR (DRAIN) ×1 IMPLANT
DRAPE HALF SHEET 40X57 (DRAPES) IMPLANT
DRAPE IMP U-DRAPE 54X76 (DRAPES) ×2 IMPLANT
DRAPE INCISE IOBAN 66X45 STRL (DRAPES) IMPLANT
DRAPE LAPAROSCOPIC ABDOMINAL (DRAPES) IMPLANT
DRAPE LAPAROTOMY 100X72 PEDS (DRAPES) ×2 IMPLANT
DRESSING MEPILEX FLEX 4X4 (GAUZE/BANDAGES/DRESSINGS) IMPLANT
DRSG ADAPTIC 3X8 NADH LF (GAUZE/BANDAGES/DRESSINGS) IMPLANT
DRSG MEPILEX FLEX 4X4 (GAUZE/BANDAGES/DRESSINGS) ×2
DRSG PAD ABDOMINAL 8X10 ST (GAUZE/BANDAGES/DRESSINGS) IMPLANT
DRSG TELFA 3X8 NADH (GAUZE/BANDAGES/DRESSINGS) IMPLANT
DRSG VAC ATS LRG SENSATRAC (GAUZE/BANDAGES/DRESSINGS) IMPLANT
DRSG VAC ATS MED SENSATRAC (GAUZE/BANDAGES/DRESSINGS) IMPLANT
DRSG VAC ATS SM SENSATRAC (GAUZE/BANDAGES/DRESSINGS) IMPLANT
ELECT CAUTERY BLADE 6.4 (BLADE) IMPLANT
ELECT REM PT RETURN 9FT ADLT (ELECTROSURGICAL) ×2
ELECTRODE REM PT RTRN 9FT ADLT (ELECTROSURGICAL) ×1 IMPLANT
EVACUATOR SILICONE 100CC (DRAIN) ×1 IMPLANT
GAUZE SPONGE 4X4 12PLY STRL (GAUZE/BANDAGES/DRESSINGS) IMPLANT
GLOVE SURG ENC MOIS LTX SZ7.5 (GLOVE) ×2 IMPLANT
GOWN STRL REUS W/ TWL LRG LVL3 (GOWN DISPOSABLE) ×1 IMPLANT
GOWN STRL REUS W/TWL LRG LVL3 (GOWN DISPOSABLE) ×1
GOWN STRL REUS W/TWL XL LVL3 (GOWN DISPOSABLE) ×4 IMPLANT
HANDPIECE INTERPULSE COAX TIP (DISPOSABLE) ×1
KIT BASIN OR (CUSTOM PROCEDURE TRAY) ×2 IMPLANT
KIT TURNOVER KIT B (KITS) ×2 IMPLANT
NDL HYPO 25GX1X1/2 BEV (NEEDLE) ×1 IMPLANT
NEEDLE HYPO 25GX1X1/2 BEV (NEEDLE) ×4 IMPLANT
NS IRRIG 1000ML POUR BTL (IV SOLUTION) ×2 IMPLANT
PACK GENERAL/GYN (CUSTOM PROCEDURE TRAY) ×2 IMPLANT
PACK UNIVERSAL I (CUSTOM PROCEDURE TRAY) ×2 IMPLANT
PAD ARMBOARD 7.5X6 YLW CONV (MISCELLANEOUS) ×4 IMPLANT
PAD DRESSING TELFA 3X8 NADH (GAUZE/BANDAGES/DRESSINGS) IMPLANT
SET HNDPC FAN SPRY TIP SCT (DISPOSABLE) IMPLANT
SPONGE T-LAP 18X18 ~~LOC~~+RFID (SPONGE) ×1 IMPLANT
STAPLER VISISTAT 35W (STAPLE) ×2 IMPLANT
SURGILUBE 2OZ TUBE FLIPTOP (MISCELLANEOUS) IMPLANT
SUT ETHILON 2 0 FS 18 (SUTURE) ×1 IMPLANT
SUT ETHILON 3 0 PS 1 (SUTURE) ×1 IMPLANT
SUT MNCRL AB 3-0 PS2 27 (SUTURE) IMPLANT
SUT MNCRL AB 4-0 PS2 18 (SUTURE) IMPLANT
SUT MON AB 2-0 CT1 36 (SUTURE) IMPLANT
SUT MON AB 5-0 PS2 18 (SUTURE) IMPLANT
SUT PDS AB 3-0 SH 27 (SUTURE) ×1 IMPLANT
SUT VIC AB 5-0 PS2 18 (SUTURE) IMPLANT
SUT VICRYL 3 0 (SUTURE) IMPLANT
SWAB COLLECTION DEVICE MRSA (MISCELLANEOUS) ×1 IMPLANT
SWAB CULTURE ESWAB REG 1ML (MISCELLANEOUS) ×1 IMPLANT
SYR CONTROL 10ML LL (SYRINGE) ×3 IMPLANT
TOWEL GREEN STERILE (TOWEL DISPOSABLE) ×2 IMPLANT
UNDERPAD 30X36 HEAVY ABSORB (UNDERPADS AND DIAPERS) ×2 IMPLANT

## 2021-03-10 NOTE — Interval H&P Note (Signed)
History and Physical Interval Note: ? ?03/10/2021 ?2:47 PM ? ?Joy Morris  has presented today for surgery, with the diagnosis of Leg Hematoma.  The various methods of treatment have been discussed with the patient and family. After consideration of risks, benefits and other options for treatment, the patient has consented to  Procedure(s): ?EVACUATION TRAUMATIC HEMATOMA OF RIGHT LOWER EXTREMITY WITH DRAIN PLACEMENT (Right) as a surgical intervention.  The patient's history has been reviewed, patient examined, no change in status, stable for surgery.  I have reviewed the patient's chart and labs.  Questions were answered to the patient's satisfaction.   ? ? ?Janne Napoleon ? ? ?

## 2021-03-10 NOTE — Anesthesia Preprocedure Evaluation (Addendum)
Anesthesia Evaluation  ?Patient identified by MRN, date of birth, ID band ?Patient awake ? ? ? ?Reviewed: ?Allergy & Precautions, NPO status , Patient's Chart, lab work & pertinent test results ? ?History of Anesthesia Complications ?Negative for: history of anesthetic complications ? ?Airway ?Mallampati: I ? ?TM Distance: >3 FB ?Neck ROM: Full ? ? ? Dental ? ?(+) Teeth Intact, Dental Advisory Given ?  ?Pulmonary ?neg pulmonary ROS,  ?  ?breath sounds clear to auscultation ? ? ? ? ? ? Cardiovascular ?negative cardio ROS ? ? ?Rhythm:Regular  ? ?  ?Neuro/Psych ?negative neurological ROS ? negative psych ROS  ? GI/Hepatic ?negative GI ROS, Neg liver ROS,   ?Endo/Other  ?negative endocrine ROS ? Renal/GU ?negative Renal ROS  ? ?  ?Musculoskeletal ?negative musculoskeletal ROS ?(+)  ? Abdominal ?  ?Peds ? Hematology ?negative hematology ROS ?(+)   ?Anesthesia Other Findings ? ? Reproductive/Obstetrics ?No results found for: PREGTESTUR, PREGSERUM, HCG, HCGQUANT ? ? ?  ? ? ? ? ? ? ? ? ? ? ? ? ? ?  ?  ? ? ? ? ? ? ? ?Anesthesia Physical ?Anesthesia Plan ? ?ASA: 1 ? ?Anesthesia Plan: General  ? ?Post-op Pain Management: Ofirmev IV (intra-op)* and Toradol IV (intra-op)*  ? ?Induction: Intravenous ? ?PONV Risk Score and Plan: 3 and Ondansetron and Dexamethasone ? ?Airway Management Planned: LMA ? ?Additional Equipment: None ? ?Intra-op Plan:  ? ?Post-operative Plan: Extubation in OR ? ?Informed Consent: I have reviewed the patients History and Physical, chart, labs and discussed the procedure including the risks, benefits and alternatives for the proposed anesthesia with the patient or authorized representative who has indicated his/her understanding and acceptance.  ? ? ? ?Dental advisory given ? ?Plan Discussed with: CRNA and Anesthesiologist ? ?Anesthesia Plan Comments:   ? ? ? ? ? ?Anesthesia Quick Evaluation ? ?

## 2021-03-10 NOTE — Anesthesia Procedure Notes (Signed)
Procedure Name: LMA Insertion ?Date/Time: 03/10/2021 3:13 PM ?Performed by: Pearson Grippe, CRNA ?Pre-anesthesia Checklist: Patient identified, Emergency Drugs available, Suction available and Patient being monitored ?Patient Re-evaluated:Patient Re-evaluated prior to induction ?Oxygen Delivery Method: Circle system utilized ?Preoxygenation: Pre-oxygenation with 100% oxygen ?Induction Type: IV induction ?Ventilation: Mask ventilation without difficulty ?LMA: LMA inserted ?LMA Size: 4.0 ?Number of attempts: 1 ?Airway Equipment and Method: Bite block ?Placement Confirmation: positive ETCO2 ?Tube secured with: Tape ?Dental Injury: Teeth and Oropharynx as per pre-operative assessment  ? ? ? ? ?

## 2021-03-10 NOTE — Anesthesia Postprocedure Evaluation (Signed)
Anesthesia Post Note ? ?Patient: [name] ? ?Procedure(s) Performed: EVACUATION TRAUMATIC HEMATOMA OF RIGHT LOWER EXTREMITY WITH DRAIN PLACEMENT (Right: Leg Lower) ? ?  ? ?Patient location during evaluation: PACU ?Anesthesia Type: General ?Level of consciousness: awake and alert ?Pain management: pain level controlled ?Vital Signs Assessment: post-procedure vital signs reviewed and stable ?Respiratory status: spontaneous breathing, nonlabored ventilation, respiratory function stable and patient connected to nasal cannula oxygen ?Cardiovascular status: blood pressure returned to baseline and stable ?Postop Assessment: no apparent nausea or vomiting ?Anesthetic complications: no ? ? ?No notable events documented. ? ?Last Vitals:  ?Vitals:  ? 03/10/21 1610 03/10/21 1625  ?BP: 124/81 129/79  ?Pulse: 79 69  ?Resp: 12 15  ?Temp:  36.7 ?C  ?SpO2: 100% 100%  ?  ?Last Pain:  ?Vitals:  ? 03/10/21 1625  ?TempSrc:   ?PainSc: 0-No pain  ? ? ?  ?  ?  ?  ?  ?  ? ?Tru Rana ? ? ? ? ?

## 2021-03-10 NOTE — Op Note (Signed)
Operative Note  ? ?DATE OF OPERATION: 03/10/2021 ? ?SURGICAL DEPARTMENT: Plastic Surgery ? ?PREOPERATIVE DIAGNOSES:  hematoma right leg pretibial below right knee  ? ?POSTOPERATIVE DIAGNOSES:  same ? ?PROCEDURE:   ?Incision and drainage hematoma right lower extremity. ? ?SURGEON: Melton Alar. Thom Ollinger, MD ? ?ASSISTANT: None ? ?ANESTHESIA:  General.  ? ?COMPLICATIONS: None.  ? ?INDICATIONS FOR PROCEDURE:  ?The patient, Joy Morris is a 46 y.o. female born on 1975/02/19, is here for treatment of right lower extremity hematoma. ?MRN: [id number] ? ?CONSENT:  ?Informed consent was obtained directly from the patient. Risks, benefits and alternatives were fully discussed. Specific risks including but not limited to bleeding, infection, hematoma, seroma, scarring, pain, contracture, asymmetry, wound healing problems, and need for further surgery were all discussed. The patient did have an ample opportunity to have questions answered to satisfaction.  ? ?DESCRIPTION OF PROCEDURE:  ?The patient was taken to the operating room. SCDs were placed and preoperative antibiotics were given. General anesthesia was administered.  The patient's operative site was prepped and draped in a sterile fashion. A time out was performed and all information was confirmed to be correct.   ? ?After time out 0.25% Marcaine was injected into the skin and hematoma cavity.  A 15 blade was utilized to make a skin incision in the inferior portion of the hematoma cavity.  Following this the cavity was entered and the primarily liquid hematoma was suctioned with a Yankauer suction.  Following this the pocket was pulse lavaged with copious saline.  After about 500 mL of irrigation no significant bloody fluid remained in the cavity.  A 15 [place] Blake drain was placed.  The wound was then closed in a layered fashion using 3-0 PDS for dermis followed by 3-0 nylon for skin in a horizontal mattress configuration. ? ?The patient tolerated the procedure well.  There  were no complications. The patient was allowed to wake from anesthesia, extubated and taken to the recovery room in satisfactory condition.   ?

## 2021-03-10 NOTE — Transfer of Care (Signed)
Immediate Anesthesia Transfer of Care Note ? ?Patient: Joy Morris ? ?Procedure(s) Performed: EVACUATION TRAUMATIC HEMATOMA OF RIGHT LOWER EXTREMITY WITH DRAIN PLACEMENT (Right: Leg Lower) ? ?Patient Location: PACU ? ?Anesthesia Type:General ? ?Level of Consciousness: awake, alert  and oriented ? ?Airway & Oxygen Therapy: Patient Spontanous Breathing and Patient connected to face mask oxygen ? ?Post-op Assessment: Report given to RN and Post -op Vital signs reviewed and stable ? ?Post vital signs: Reviewed and stable ? ?Last Vitals:  ?Vitals Value Taken Time  ?BP 118/67 03/10/21 1555  ?Temp    ?Pulse 87 03/10/21 1555  ?Resp 15 03/10/21 1555  ?SpO2 100 % 03/10/21 1555  ?Vitals shown include unvalidated device data. ? ?Last Pain:  ?Vitals:  ? 03/10/21 1155  ?TempSrc:   ?PainSc: 0-No pain  ?   ? ?Patients Stated Pain Goal: 0 (03/08/21 [id number]) ? ?Complications: No notable events documented. ?

## 2021-03-11 ENCOUNTER — Encounter (HOSPITAL_COMMUNITY): Payer: Self-pay | Admitting: Plastic Surgery

## 2021-03-14 ENCOUNTER — Telehealth: Payer: Self-pay

## 2021-03-14 NOTE — Telephone Encounter (Signed)
Spoke to Dr. Domenica Reamer- he stated that if she feels like fluid is re-accumulating then we can see her Tuesday or Wednesday, however, since he went in and drained the hematoma and cleaned out the area there probably just isn't much drainage to have. She stated if she feels like things are getting worse she will make appointment but for the moment she will keep her Friday appointment.  ?

## 2021-03-14 NOTE — Telephone Encounter (Signed)
Patient called in stating she had surgery to remove the hematoma. Joy Morris has drains placed, but has noticed that it has not drained but just a little since surgery. Asking for a call back to see if it this is normal or if she needs an appointment.  ?

## 2021-03-15 LAB — AEROBIC/ANAEROBIC CULTURE W GRAM STAIN (SURGICAL/DEEP WOUND)
Culture: NO GROWTH
Gram Stain: NONE SEEN

## 2021-03-18 ENCOUNTER — Ambulatory Visit (INDEPENDENT_AMBULATORY_CARE_PROVIDER_SITE_OTHER): Payer: No Typology Code available for payment source | Admitting: Plastic Surgery

## 2021-03-18 ENCOUNTER — Other Ambulatory Visit: Payer: Self-pay

## 2021-03-18 DIAGNOSIS — S8011XA Contusion of right lower leg, initial encounter: Secondary | ICD-10-CM

## 2021-03-20 ENCOUNTER — Encounter: Payer: Self-pay | Admitting: Plastic Surgery

## 2021-03-20 NOTE — Progress Notes (Signed)
Patient is status post evacuation of hematoma right knee.  She notes some increased swelling. ? ?Physical exam ?Some increased fluid collection although drain is intact with serosanguineous fluid ? ?Assessment and plan ?Her drain has curiously stopped draining.  Upon opening her drain incision serosanguineous fluid was evacuated.  We will continue to watch closely to make sure that she does not not need another evacuation. ?

## 2021-03-25 ENCOUNTER — Ambulatory Visit: Payer: No Typology Code available for payment source | Admitting: Plastic Surgery

## 2021-03-28 ENCOUNTER — Ambulatory Visit (INDEPENDENT_AMBULATORY_CARE_PROVIDER_SITE_OTHER): Payer: No Typology Code available for payment source | Admitting: Plastic Surgery

## 2021-03-28 ENCOUNTER — Other Ambulatory Visit: Payer: Self-pay

## 2021-03-28 DIAGNOSIS — S8011XA Contusion of right lower leg, initial encounter: Secondary | ICD-10-CM

## 2021-03-28 NOTE — Progress Notes (Signed)
The patient is status post evacuation of hematoma from the right knee.  She does note that she still has some ongoing pain and swelling.  She has been packing the wound but packing strips have gotten very small since the wound has gotten smaller. ? ?Physical exam ?Incisions clean dry intact, no residual hematoma or fluid collection on physical exam. ? ?Assessment and plan  ?The patient is doing well status post drainage of hematoma.  Discussed discontinuing packing soon. ? ? ? ? ? ? ? ? ? ? ? ? ? ?
